# Patient Record
Sex: Female | Born: 1985
Health system: Southern US, Community
[De-identification: ages and names within clinical notes are randomized; demographics above are authoritative.]

## PROBLEM LIST (undated history)

## (undated) DIAGNOSIS — I209 Angina pectoris, unspecified: Secondary | ICD-10-CM

## (undated) DIAGNOSIS — G43909 Migraine, unspecified, not intractable, without status migrainosus: Secondary | ICD-10-CM

## (undated) DIAGNOSIS — R001 Bradycardia, unspecified: Secondary | ICD-10-CM

## (undated) DIAGNOSIS — N920 Excessive and frequent menstruation with regular cycle: Secondary | ICD-10-CM

## (undated) HISTORY — DX: Migraine, unspecified, not intractable, without status migrainosus: G43.909

## (undated) HISTORY — PX: TUBAL LIGATION: SHX77

---
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
## 2004-01-14 ENCOUNTER — Inpatient Hospital Stay (HOSPITAL_COMMUNITY): Admission: AD | Admit: 2004-01-14 | Discharge: 2004-01-14 | Payer: Self-pay | Admitting: Obstetrics and Gynecology

## 2004-06-12 ENCOUNTER — Emergency Department (HOSPITAL_COMMUNITY): Admission: EM | Admit: 2004-06-12 | Discharge: 2004-06-12 | Payer: Self-pay | Admitting: Emergency Medicine

## 2004-07-26 ENCOUNTER — Other Ambulatory Visit: Admission: RE | Admit: 2004-07-26 | Discharge: 2004-07-26 | Payer: Self-pay | Admitting: Obstetrics and Gynecology

## 2004-10-02 ENCOUNTER — Emergency Department (HOSPITAL_COMMUNITY): Admission: EM | Admit: 2004-10-02 | Discharge: 2004-10-02 | Payer: Self-pay | Admitting: Emergency Medicine

## 2004-11-28 ENCOUNTER — Emergency Department (HOSPITAL_COMMUNITY): Admission: EM | Admit: 2004-11-28 | Discharge: 2004-11-29 | Payer: Self-pay | Admitting: Emergency Medicine

## 2005-01-03 ENCOUNTER — Emergency Department (HOSPITAL_COMMUNITY): Admission: EM | Admit: 2005-01-03 | Discharge: 2005-01-03 | Payer: Self-pay | Admitting: Emergency Medicine

## 2005-01-09 ENCOUNTER — Emergency Department (HOSPITAL_COMMUNITY): Admission: EM | Admit: 2005-01-09 | Discharge: 2005-01-09 | Payer: Self-pay | Admitting: Family Medicine

## 2005-01-27 ENCOUNTER — Emergency Department (HOSPITAL_COMMUNITY): Admission: EM | Admit: 2005-01-27 | Discharge: 2005-01-27 | Payer: Self-pay | Admitting: Psychiatry

## 2005-03-04 ENCOUNTER — Emergency Department (HOSPITAL_COMMUNITY): Admission: EM | Admit: 2005-03-04 | Discharge: 2005-03-04 | Payer: Self-pay | Admitting: Emergency Medicine

## 2005-03-24 ENCOUNTER — Emergency Department (HOSPITAL_COMMUNITY): Admission: EM | Admit: 2005-03-24 | Discharge: 2005-03-24 | Payer: Self-pay | Admitting: Family Medicine

## 2005-05-19 ENCOUNTER — Emergency Department (HOSPITAL_COMMUNITY): Admission: EM | Admit: 2005-05-19 | Discharge: 2005-05-19 | Payer: Self-pay | Admitting: Emergency Medicine

## 2005-07-16 ENCOUNTER — Emergency Department (HOSPITAL_COMMUNITY): Admission: EM | Admit: 2005-07-16 | Discharge: 2005-07-16 | Payer: Self-pay | Admitting: Family Medicine

## 2005-09-08 ENCOUNTER — Inpatient Hospital Stay (HOSPITAL_COMMUNITY): Admission: AD | Admit: 2005-09-08 | Discharge: 2005-09-08 | Payer: Self-pay | Admitting: Family Medicine

## 2005-09-09 ENCOUNTER — Inpatient Hospital Stay (HOSPITAL_COMMUNITY): Admission: AD | Admit: 2005-09-09 | Discharge: 2005-09-10 | Payer: Self-pay | Admitting: *Deleted

## 2005-12-09 ENCOUNTER — Emergency Department (HOSPITAL_COMMUNITY): Admission: EM | Admit: 2005-12-09 | Discharge: 2005-12-09 | Payer: Self-pay | Admitting: Emergency Medicine

## 2006-10-09 ENCOUNTER — Emergency Department (HOSPITAL_COMMUNITY): Admission: EM | Admit: 2006-10-09 | Discharge: 2006-10-09 | Payer: Self-pay | Admitting: Emergency Medicine

## 2007-01-02 ENCOUNTER — Emergency Department (HOSPITAL_COMMUNITY): Admission: EM | Admit: 2007-01-02 | Discharge: 2007-01-02 | Payer: Self-pay | Admitting: Family Medicine

## 2007-10-01 ENCOUNTER — Ambulatory Visit: Payer: Self-pay | Admitting: Internal Medicine

## 2007-10-01 ENCOUNTER — Encounter (INDEPENDENT_AMBULATORY_CARE_PROVIDER_SITE_OTHER): Payer: Self-pay | Admitting: Nurse Practitioner

## 2007-10-01 LAB — CONVERTED CEMR LAB
Alkaline Phosphatase: 49 units/L (ref 39–117)
CO2: 23 meq/L (ref 19–32)
Creatinine, Ser: 0.83 mg/dL (ref 0.40–1.20)
Eosinophils Absolute: 0 10*3/uL (ref 0.0–0.7)
Eosinophils Relative: 1 % (ref 0–5)
Glucose, Bld: 91 mg/dL (ref 70–99)
HCT: 42.3 % (ref 36.0–46.0)
Hemoglobin: 13.7 g/dL (ref 12.0–15.0)
Lymphocytes Relative: 40 % (ref 12–46)
Lymphs Abs: 1.5 10*3/uL (ref 0.7–4.0)
MCV: 81.3 fL (ref 78.0–100.0)
Monocytes Absolute: 0.4 10*3/uL (ref 0.1–1.0)
Platelets: 279 10*3/uL (ref 150–400)
RDW: 13.8 % (ref 11.5–15.5)
Sodium: 139 meq/L (ref 135–145)
TSH: 1.155 microintl units/mL (ref 0.350–5.50)
Total Bilirubin: 0.3 mg/dL (ref 0.3–1.2)

## 2007-10-04 ENCOUNTER — Ambulatory Visit: Payer: Self-pay | Admitting: *Deleted

## 2007-10-07 ENCOUNTER — Ambulatory Visit (HOSPITAL_COMMUNITY): Admission: RE | Admit: 2007-10-07 | Discharge: 2007-10-07 | Payer: Self-pay | Admitting: Family Medicine

## 2007-10-18 ENCOUNTER — Inpatient Hospital Stay (HOSPITAL_COMMUNITY): Admission: AD | Admit: 2007-10-18 | Discharge: 2007-10-18 | Payer: Self-pay | Admitting: Obstetrics & Gynecology

## 2007-11-01 ENCOUNTER — Encounter (INDEPENDENT_AMBULATORY_CARE_PROVIDER_SITE_OTHER): Payer: Self-pay | Admitting: Family Medicine

## 2007-11-01 ENCOUNTER — Ambulatory Visit: Payer: Self-pay | Admitting: Internal Medicine

## 2008-07-30 ENCOUNTER — Emergency Department (HOSPITAL_COMMUNITY): Admission: EM | Admit: 2008-07-30 | Discharge: 2008-07-30 | Payer: Self-pay | Admitting: Family Medicine

## 2008-08-30 ENCOUNTER — Encounter: Admission: RE | Admit: 2008-08-30 | Discharge: 2008-08-30 | Payer: Self-pay | Admitting: Family Medicine

## 2009-02-06 ENCOUNTER — Emergency Department (HOSPITAL_COMMUNITY): Admission: EM | Admit: 2009-02-06 | Discharge: 2009-02-06 | Payer: Self-pay | Admitting: Family Medicine

## 2009-02-08 ENCOUNTER — Inpatient Hospital Stay (HOSPITAL_COMMUNITY): Admission: AD | Admit: 2009-02-08 | Discharge: 2009-02-08 | Payer: Self-pay | Admitting: Obstetrics and Gynecology

## 2009-09-24 ENCOUNTER — Inpatient Hospital Stay (HOSPITAL_COMMUNITY): Admission: AD | Admit: 2009-09-24 | Discharge: 2009-09-24 | Payer: Self-pay | Admitting: Obstetrics and Gynecology

## 2009-09-27 ENCOUNTER — Inpatient Hospital Stay (HOSPITAL_COMMUNITY): Admission: AD | Admit: 2009-09-27 | Discharge: 2009-09-29 | Payer: Self-pay | Admitting: Obstetrics and Gynecology

## 2010-03-17 ENCOUNTER — Emergency Department (HOSPITAL_COMMUNITY): Admission: EM | Admit: 2010-03-17 | Discharge: 2010-03-18 | Payer: Self-pay | Admitting: Emergency Medicine

## 2010-08-13 LAB — COMPREHENSIVE METABOLIC PANEL
ALT: 10 U/L (ref 0–35)
Alkaline Phosphatase: 131 U/L — ABNORMAL HIGH (ref 39–117)
BUN: 6 mg/dL (ref 6–23)
CO2: 20 mEq/L (ref 19–32)
Chloride: 105 mEq/L (ref 96–112)
GFR calc non Af Amer: >60 mL/min (ref >60)
Glucose, Bld: 89 mg/dL (ref 70–99)
Potassium: 3.9 mEq/L (ref 3.5–5.1)
Sodium: 134 mEq/L — ABNORMAL LOW (ref 135–145)
Total Bilirubin: 0.3 mg/dL (ref 0.3–1.2)
Total Protein: 6.3 g/dL (ref 6.0–8.3)

## 2010-08-13 LAB — CBC
HCT: 32.3 % — ABNORMAL LOW (ref 36.0–46.0)
HCT: 36.8 % (ref 36.0–46.0)
Hemoglobin: 12.6 g/dL (ref 12.0–15.0)
Platelets: 151 10*3/uL (ref 150–400)
RBC: 4.36 MIL/uL (ref 3.87–5.11)
RDW: 13 % (ref 11.5–15.5)
WBC: 7.5 10*3/uL (ref 4.0–10.5)

## 2010-08-13 LAB — RPR: RPR Ser Ql: NONREACTIVE

## 2010-08-30 LAB — URINALYSIS, ROUTINE W REFLEX MICROSCOPIC
Bilirubin Urine: NEGATIVE
Nitrite: NEGATIVE
Specific Gravity, Urine: <1.005 — ABNORMAL LOW (ref 1.005–1.030)
Urobilinogen, UA: 0.2 mg/dL (ref 0.0–1.0)

## 2010-08-30 LAB — POCT I-STAT, CHEM 8
HCT: 45 % (ref 36.0–46.0)
Hemoglobin: 15.3 g/dL — ABNORMAL HIGH (ref 12.0–15.0)
Potassium: 3.5 mEq/L (ref 3.5–5.1)
Sodium: 137 mEq/L (ref 135–145)

## 2010-08-30 LAB — TSH: TSH: 2.834 u[IU]/mL (ref 0.350–4.500)

## 2010-08-30 LAB — GC/CHLAMYDIA PROBE AMP, GENITAL: Chlamydia, DNA Probe: NEGATIVE

## 2010-08-30 LAB — WET PREP, GENITAL: Trich, Wet Prep: NONE SEEN

## 2010-08-30 LAB — POCT URINALYSIS DIP (DEVICE)
Glucose, UA: NEGATIVE mg/dL
Hgb urine dipstick: NEGATIVE
Nitrite: NEGATIVE

## 2010-09-05 LAB — DIFFERENTIAL
Basophils Absolute: 0 10*3/uL (ref 0.0–0.1)
Lymphocytes Relative: 32 % (ref 12–46)
Monocytes Absolute: 0.3 10*3/uL (ref 0.1–1.0)
Monocytes Relative: 7 % (ref 3–12)
Neutro Abs: 2.8 10*3/uL (ref 1.7–7.7)

## 2010-09-05 LAB — POCT I-STAT, CHEM 8
Chloride: 105 mEq/L (ref 96–112)
Creatinine, Ser: 1 mg/dL (ref 0.4–1.2)
Glucose, Bld: 61 mg/dL — ABNORMAL LOW (ref 70–99)
HCT: 40 % (ref 36.0–46.0)
Potassium: 3.7 mEq/L (ref 3.5–5.1)

## 2010-09-05 LAB — CBC
Hemoglobin: 12.2 g/dL (ref 12.0–15.0)
RBC: 4.28 MIL/uL (ref 3.87–5.11)
WBC: 4.7 10*3/uL (ref 4.0–10.5)

## 2010-10-11 NOTE — Discharge Summary (Signed)
   NAMEBABYGIRL, TRAGER                        ACCOUNT NO.:  [id number]   MEDICAL RECORD NO.:  [id number]                   PATIENT TYPE:  INP   LOCATION:  9133                                 FACILITY:  [facility]   PHYSICIAN:  Tanya S. Shawnie Pons, M.D.                DATE OF BIRTH:  [birth year]   DATE OF ADMISSION:  06/10/2002  DATE OF DISCHARGE:  06/14/2002                                 DISCHARGE SUMMARY   DISCHARGE DIAGNOSES:  1. Spontaneous vaginal delivery of viable female status post induction of     labor with Pitocin.  2. Preeclampsia with magnesium sulfate during labor, resolved.   DISCHARGE MEDICATIONS:  1. Ibuprofen 600 mg one tablet p.o. q.6h. p.r.n. pain.  2. Micronor one tablet p.o. daily.  3. Prenatal vitamins one tablet p.o. daily.  4. Ferrous sulfate 325 mg one tablet p.o. b.i.d. with meals.   BRIEF ADMISSION HISTORY:  The patient is a 25 year old G1, P0 who presented  at 35 and 2/[redacted] weeks gestation for induction of labor and [facility] laboratories.  Her blood pressure upon initial examination was 119/88.  She was augmented  during labor with Pitocin.  There were too many uterine contractions for  Cytotec to be used.  During labor she was deemed to be preeclamptic and was  managed with magnesium sulfate therapy.  This was continued for  approximately 24 hours postpartum.  The magnesium sulfate was discontinued  on postpartum day number two after the patient began diuresing and no longer  had headache.  The patient did well throughout the rest of her  hospitalization.  Anemia.  The patient does have sickle cell trait and was  known to be anemic prior to presenting to hospital.  She had been maintained  on ferrous sulfate 325 mg p.o. b.i.d.  She experienced some acute blood loss  during delivery and her hematocrit dropped to 7.8 on June 12, 2002.  Consequently, she will be discharged on her ferrous sulfate b.i.d. and will  need to be monitored as an outpatient.   DIET:   Regular.   ACTIVITY:  The patient was put on pelvic rest with no sexual activity for  six weeks.   FOLLOWUP CARE:  The patient is to return to [facility] in six weeks for  postpartum check and possible IUD placement.  However, patient states she  might make an appointment with Janine Limbo, M.D. instead.   DISCHARGE:  The patient will be discharged home, stable.     Rodolph Bong, M.D.                        Shelbie Proctor. Shawnie Pons, M.D.    AK/MEDQ  D:  06/14/2002  T:  06/15/2002  Job:  [id number]

## 2011-02-19 LAB — WET PREP, GENITAL: Trich, Wet Prep: NONE SEEN

## 2011-02-19 LAB — URINALYSIS, ROUTINE W REFLEX MICROSCOPIC
Bilirubin Urine: NEGATIVE
Hgb urine dipstick: NEGATIVE
Nitrite: NEGATIVE
Specific Gravity, Urine: 1.025
pH: 6

## 2011-02-19 LAB — POCT PREGNANCY, URINE
Operator id: 222261
Preg Test, Ur: NEGATIVE

## 2011-02-19 LAB — GC/CHLAMYDIA PROBE AMP, GENITAL: GC Probe Amp, Genital: NEGATIVE

## 2011-04-24 ENCOUNTER — Emergency Department (HOSPITAL_COMMUNITY)
Admission: EM | Admit: 2011-04-24 | Discharge: 2011-04-24 | Disposition: A | Discharge status: Home / Self Care | Payer: No Typology Code available for payment source | Attending: Emergency Medicine | Admitting: Emergency Medicine

## 2011-04-24 ENCOUNTER — Emergency Department (HOSPITAL_COMMUNITY): Payer: No Typology Code available for payment source

## 2011-04-24 ENCOUNTER — Encounter: Payer: Self-pay | Admitting: Emergency Medicine

## 2011-04-24 DIAGNOSIS — S40019A Contusion of unspecified shoulder, initial encounter: Secondary | ICD-10-CM | POA: Insufficient documentation

## 2011-04-24 DIAGNOSIS — S139XXA Sprain of joints and ligaments of unspecified parts of neck, initial encounter: Secondary | ICD-10-CM | POA: Insufficient documentation

## 2011-04-24 DIAGNOSIS — Y92410 Unspecified street and highway as the place of occurrence of the external cause: Secondary | ICD-10-CM | POA: Insufficient documentation

## 2011-04-24 DIAGNOSIS — S161XXA Strain of muscle, fascia and tendon at neck level, initial encounter: Secondary | ICD-10-CM

## 2011-04-24 DIAGNOSIS — R51 Headache: Secondary | ICD-10-CM | POA: Insufficient documentation

## 2011-04-24 MED ORDER — OXYCODONE-ACETAMINOPHEN 5-325 MG PO TABS
2.0000 | ORAL_TABLET | Freq: Once | ORAL | Status: AC
  Administered 2011-04-24: 1 via ORAL
  Filled 2011-04-24: qty 2

## 2011-04-24 MED ORDER — OXYCODONE-ACETAMINOPHEN 5-325 MG PO TABS: 2.0000 | ORAL_TABLET | ORAL | Status: AC | PRN
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
## 2011-09-08 ENCOUNTER — Telehealth: Payer: Self-pay | Admitting: Obstetrics and Gynecology

## 2011-09-08 NOTE — Telephone Encounter (Signed)
Routed to triage 

## 2011-09-09 ENCOUNTER — Telehealth: Payer: Self-pay | Admitting: Registered Nurse

## 2011-09-09 NOTE — Telephone Encounter (Signed)
TC from pt.   Informed of lab results of 06/2011.   Advised to call with any sx of outbreak of HSV.  Pt verbalizes comprehension.

## 2011-09-09 NOTE — Telephone Encounter (Signed)
Returning nancy's call

## 2011-09-09 NOTE — Telephone Encounter (Signed)
TC to pt. TC to pt. LM to return call.

## 2011-09-09 NOTE — Telephone Encounter (Signed)
TC to pt.   LM to return call.   May speak to anyone in triage.

## 2012-08-23 ENCOUNTER — Emergency Department (INDEPENDENT_AMBULATORY_CARE_PROVIDER_SITE_OTHER)
Admission: EM | Admit: 2012-08-23 | Discharge: 2012-08-23 | Disposition: A | Discharge status: Home / Self Care | Payer: Self-pay | Source: Home / Self Care | Attending: Emergency Medicine | Admitting: Emergency Medicine

## 2012-08-23 ENCOUNTER — Encounter (HOSPITAL_COMMUNITY): Payer: Self-pay | Admitting: *Deleted

## 2012-08-23 DIAGNOSIS — M549 Dorsalgia, unspecified: Secondary | ICD-10-CM

## 2012-08-23 MED ORDER — CYCLOBENZAPRINE HCL 10 MG PO TABS: 10.0000 mg | ORAL_TABLET | Freq: Two times a day (BID) | ORAL | Status: DC | PRN

## 2012-08-23 MED ORDER — IBUPROFEN 400 MG PO TABS: 400.0000 mg | ORAL_TABLET | Freq: Four times a day (QID) | ORAL | Status: DC | PRN

## 2012-08-23 NOTE — ED Provider Notes (Signed)
Medical screening examination/treatment/procedure(s) were performed by non-physician practitioner and as supervising physician I was immediately available for consultation/collaboration.  Torri Langston   Osher Oettinger, MD 08/23/12 1819 
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
## 2013-03-12 ENCOUNTER — Other Ambulatory Visit (HOSPITAL_COMMUNITY): Payer: Self-pay | Admitting: *Deleted

## 2013-03-12 DIAGNOSIS — N6310 Unspecified lump in the right breast, unspecified quadrant: Secondary | ICD-10-CM

## 2013-03-15 ENCOUNTER — Encounter (INDEPENDENT_AMBULATORY_CARE_PROVIDER_SITE_OTHER): Payer: Self-pay

## 2013-03-15 ENCOUNTER — Encounter (HOSPITAL_COMMUNITY): Payer: Self-pay

## 2013-03-15 ENCOUNTER — Ambulatory Visit (HOSPITAL_COMMUNITY)
Admission: RE | Admit: 2013-03-15 | Discharge: 2013-03-15 | Disposition: A | Discharge status: Home / Self Care | Payer: Self-pay | Source: Ambulatory Visit | Attending: Obstetrics and Gynecology | Admitting: Obstetrics and Gynecology

## 2013-03-15 VITALS — BP 104/60 | Temp 99.4°F | Ht 63.0 in | Wt 160.2 lb

## 2013-03-15 DIAGNOSIS — N6312 Unspecified lump in the right breast, upper inner quadrant: Secondary | ICD-10-CM | POA: Insufficient documentation

## 2013-03-15 DIAGNOSIS — Z1239 Encounter for other screening for malignant neoplasm of breast: Secondary | ICD-10-CM

## 2013-03-15 NOTE — Progress Notes (Signed)
Patient complained of right pebble sized breast lump x 1 month.  Pap Smear:  Pap smear not completed today. Last Pap smear was February 2013 by Dr. Stefano Gaul and normal per patient. Per patient has no history of an abnormal Pap smear. Pap smear result prior to most recent Pap smear in [facility].  Physical exam: Breasts Left breast larger than right breast. No skin abnormalities bilateral breasts. No nipple retraction bilateral breasts. No nipple discharge bilateral breasts. No lymphadenopathy. No lumps palpated left breast. Palpated a small moveable lump within the right breast at 2 o'clock 4 cm from the nipple. No complaints of pain or tenderness on exam. Referred patient to the [facility] for right breast ultrasound. Appointment scheduled for Thursday, March 24, 2013 at 1000.     Pelvic/Bimanual No Pap smear completed today since last Pap smear was February 2013 per patient. Pap smear not indicated per BCCCP guidelines.

## 2013-03-15 NOTE — Patient Instructions (Signed)
Taught Tanja Port how to perform BSE and gave educational materials to take home. Patient did not need a Pap smear today due to last Pap smear was in 2013 per patient. Told patient about free cervical cancer screenings to receive a Pap smear if would like one next year. Let her know [facility]CP will cover Pap smears every 3 years unless has a history of abnormal Pap smears. Referred patient to the [facility] for right breast ultrasound. Appointment scheduled for Thursday, March 24, 2013 at 1000. Patient aware of appointment and will be there.  Sharyn Creamer A Callies verbalized understanding.  [place]annock, Kathaleen Maser, RN 3:20 PM

## 2013-03-23 ENCOUNTER — Ambulatory Visit
Admission: RE | Admit: 2013-03-23 | Discharge: 2013-03-23 | Disposition: A | Discharge status: Home / Self Care | Payer: No Typology Code available for payment source | Source: Ambulatory Visit | Attending: Obstetrics and Gynecology | Admitting: Obstetrics and Gynecology

## 2013-03-23 DIAGNOSIS — N6310 Unspecified lump in the right breast, unspecified quadrant: Secondary | ICD-10-CM

## 2013-03-24 ENCOUNTER — Other Ambulatory Visit: Payer: Self-pay

## 2013-05-26 HISTORY — PX: BREAST BIOPSY: SHX20

## 2013-08-01 ENCOUNTER — Emergency Department (HOSPITAL_COMMUNITY)
Admission: EM | Admit: 2013-08-01 | Discharge: 2013-08-01 | Disposition: A | Discharge status: Home / Self Care | Payer: BC Managed Care – PPO | Source: Home / Self Care | Attending: Emergency Medicine | Admitting: Emergency Medicine

## 2013-08-01 ENCOUNTER — Other Ambulatory Visit (HOSPITAL_COMMUNITY)
Admission: RE | Admit: 2013-08-01 | Discharge: 2013-08-01 | Disposition: A | Discharge status: Home / Self Care | Payer: BC Managed Care – PPO | Source: Ambulatory Visit | Attending: Emergency Medicine | Admitting: Emergency Medicine

## 2013-08-01 ENCOUNTER — Encounter (HOSPITAL_COMMUNITY): Payer: Self-pay | Admitting: Emergency Medicine

## 2013-08-01 DIAGNOSIS — N760 Acute vaginitis: Secondary | ICD-10-CM | POA: Insufficient documentation

## 2013-08-01 DIAGNOSIS — Z113 Encounter for screening for infections with a predominantly sexual mode of transmission: Secondary | ICD-10-CM | POA: Insufficient documentation

## 2013-08-01 DIAGNOSIS — R42 Dizziness and giddiness: Secondary | ICD-10-CM

## 2013-08-01 DIAGNOSIS — I498 Other specified cardiac arrhythmias: Secondary | ICD-10-CM

## 2013-08-01 DIAGNOSIS — R001 Bradycardia, unspecified: Secondary | ICD-10-CM

## 2013-08-01 LAB — POCT I-STAT, CHEM 8
BUN: 6 mg/dL (ref 6–23)
CALCIUM ION: 1.17 mmol/L (ref 1.12–1.23)
CHLORIDE: 104 meq/L (ref 96–112)
Creatinine, Ser: 0.9 mg/dL (ref 0.50–1.10)
GLUCOSE: 90 mg/dL (ref 70–99)
HCT: 41 % (ref 36.0–46.0)
Hemoglobin: 13.9 g/dL (ref 12.0–15.0)
Potassium: 3.7 mEq/L (ref 3.7–5.3)
Sodium: 140 mEq/L (ref 137–147)
TCO2: 23 mmol/L (ref 0–100)

## 2013-08-01 LAB — RPR: RPR: NONREACTIVE

## 2013-08-01 LAB — HIV ANTIBODY (ROUTINE TESTING W REFLEX): HIV: NONREACTIVE

## 2013-08-01 MED ORDER — METRONIDAZOLE 0.75 % VA GEL: 1.0000 | Freq: Two times a day (BID) | VAGINAL | Status: DC

## 2013-08-01 NOTE — ED Notes (Signed)
Pelvic equipment available and patient in gown

## 2013-08-01 NOTE — ED Provider Notes (Signed)
CSN: [id number]     Arrival date & time 08/01/13  1230 History   First MD Initiated Contact with Patient 08/01/13 1325     Chief Complaint  Patient presents with  . Vaginal Discharge  . Dizziness   (Consider location/radiation/quality/duration/timing/severity/associated sxs/prior Treatment) HPI Comments: Patient presents with 2 complaints. First is malodorous vaginal discharge that has been present since she finished her period 4 days ago. She believes she may have bacterial vaginosis in the past with symptoms. Also, she had an episode of dizziness last night and seeing black spots in her vision. This is the first time that has ever happened to her. She laid down and felt better very quickly. This morning, she has a headache behind her left eye. Not having any more dizziness. She felt slightly short of breath while she was having the dizziness. She denies chest pain, palpitations, NVD. She has a history of iron deficiency anemia.  Patient is a 28 y.o. female presenting with vaginal discharge and dizziness.  Vaginal Discharge Associated symptoms: no abdominal pain, no dysuria, no fever, no nausea and no vomiting   Dizziness Associated symptoms: no chest pain, no nausea, no palpitations, no shortness of breath and no vomiting     History reviewed. No pertinent past medical history. Past Surgical History  Procedure Laterality Date  . Tubal ligation     Family History  Problem Relation Age of Onset  . Hypertension Mother    History  Substance Use Topics  . Smoking status: Current Every Day Smoker  . Smokeless tobacco: Never Used  . Alcohol Use: No   OB History   Grav Para Term Preterm Abortions TAB SAB Ect Mult Living   2 2 2       2      Review of Systems  Constitutional: Negative for fever and chills.  Eyes: Positive for visual disturbance.  Respiratory: Negative for cough and shortness of breath.   Cardiovascular: Negative for chest pain, palpitations and leg swelling.   Gastrointestinal: Negative for nausea, vomiting and abdominal pain.  Endocrine: Negative for polydipsia and polyuria.  Genitourinary: Positive for vaginal discharge. Negative for dysuria, urgency, frequency, hematuria, flank pain, vaginal bleeding, difficulty urinating, genital sores and pelvic pain.  Musculoskeletal: Negative for arthralgias and myalgias.  Skin: Negative for rash.  Neurological: Positive for dizziness. Negative for weakness and light-headedness.    Allergies  Review of patient's allergies indicates no known allergies.  Home Medications   Current Outpatient Rx  Name  Route  Sig  Dispense  Refill  . Naproxen Sodium (ALEVE PO)   Oral   Take by mouth.         . metroNIDAZOLE (METROGEL VAGINAL) 0.75 % vaginal gel   Vaginal   Place 1 Applicatorful vaginally 2 (two) times daily. For 5 days   70 g   0    BP 111/64  Pulse 90  Temp(Src) 98.8 F (37.1 C) (Oral)  Resp 16  SpO2 100%  LMP 07/24/2013 Physical Exam  Nursing note and vitals reviewed. Constitutional: She is oriented to person, place, and time. Vital signs are normal. She appears well-developed and well-nourished. No distress.  HENT:  Head: Normocephalic and atraumatic.  Eyes: Conjunctivae are normal. Right eye exhibits no discharge. Left eye exhibits no discharge.  Neck: Normal range of motion. Neck supple. No thyromegaly present.  Cardiovascular: Normal rate, regular rhythm and normal heart sounds.  Exam reveals no gallop and no friction rub.   No murmur heard. Pulmonary/Chest: Breath sounds normal.  No respiratory distress. She has no wheezes. She has no rales.  Abdominal: Soft. She exhibits no distension and no mass. There is no tenderness. There is no rebound and no guarding.  Genitourinary: Vaginal discharge (thin white) found.  Lymphadenopathy:    She has no cervical adenopathy.  Neurological: She is alert and oriented to person, place, and time. She has normal strength. No cranial nerve  deficit. She exhibits normal muscle tone. Coordination normal.  Skin: Skin is warm and dry. No rash noted. She is not diaphoretic.  Psychiatric: She has a normal mood and affect. Judgment and thought content normal.    ED Course  Procedures (including critical care time) Labs Review Labs Reviewed  RPR  HIV ANTIBODY (ROUTINE TESTING)  I-STAT CHEM 8, ED  POCT I-STAT, CHEM 8  CERVICOVAGINAL ANCILLARY ONLY   Imaging Review No results found.  EKG shows sinus bradycardia  MDM   1. Vaginitis   2. Dizziness   3. Bradycardia    Patient is not orthostatic. Her EKG revealed sinus bradycardia. Her i-STAT is normal. She is being referred to cardiology for evaluation of possible symptomatic bradycardia. Treating the probable bacterial vaginosis with MetroGel twice a day for 5 days. Followup when necessary if not improving   Meds ordered this encounter  Medications  . Naproxen Sodium (ALEVE PO)    Sig: Take by mouth.  . metroNIDAZOLE (METROGEL VAGINAL) 0.75 % vaginal gel    Sig: Place 1 Applicatorful vaginally 2 (two) times daily. For 5 days    Dispense:  70 g    Refill:  0    Order Specific Question:  Supervising Provider    Answer:  Jake Michaelis, DAVID C [[id number]]       Liam Graham, PA-C 08/01/13 1531

## 2013-08-01 NOTE — ED Notes (Signed)
Patient states her main concern is vaginal discharge, thinks this is bv and has an odor.  Menstrual cycle went off on 3/5 and noticed odor right away.   Reports dizziness, seeing dark spots and dots in vision field, and dizzy.  Overall felt bad last night.  Today his slight headache , but feels better than last night.

## 2013-08-01 NOTE — ED Notes (Signed)
Stressed the importance of follow up with cardiology. Patient states she is going right now to make contact with cardiology office.

## 2013-08-01 NOTE — Discharge Instructions (Signed)
Bradycardia Bradycardia is a term for a heart rate (pulse) that, in adults, is slower than 60 beats per minute. A normal rate is 60 to 100 beats per minute. A heart rate below 60 beats per minute may be normal for some adults with healthy hearts. If the rate is too slow, the heart may have trouble pumping the volume of blood the body needs. If the heart rate gets too low, blood flow to the brain may be decreased and may make you feel lightheaded, dizzy, or faint. The heart has a natural pacemaker in the top of the heart called the SA node (sinoatrial or sinus node). This pacemaker sends out regular electrical signals to the muscle of the heart, telling the heart muscle when to beat (contract). The electrical signal travels from the upper parts of the heart (atria) through the AV node (atrioventricular node), to the lower chambers of the heart (ventricles). The ventricles squeeze, pumping the blood from your heart to your lungs and to the rest of your body. CAUSES   Problem with the heart's electrical system.  Problem with the heart's natural pacemaker.  Heart disease, damage, or infection.  Medications.  Problems with minerals and salts (electrolytes). SYMPTOMS   Fainting (syncope).  Fatigue and weakness.  Shortness of breath (dyspnea).  Chest pain (angina).  Drowsiness.  Confusion. DIAGNOSIS   An electrocardiogram (ECG) can help your caregiver determine the type of slow heart rate you have.  If the cause is not seen on an ECG, you may need to wear a heart monitor that records your heart rhythm for several hours or days.  Blood tests. TREATMENT   Electrolyte supplements.  Medications.  Withholding medication which is causing a slow heart rate.  Pacemaker placement. SEEK IMMEDIATE MEDICAL CARE IF:   You feel lightheaded or faint.  You develop an irregular heart rate.  You feel chest pain or have trouble breathing. MAKE SURE YOU:   Understand these  instructions.  Will watch your condition.  Will get help right away if you are not doing well or get worse. Document Released: 02/01/2002 Document Revised: 08/04/2011 Document Reviewed: 12/29/2007 [facility] Patient Information 2014 [place].  Dizziness Dizziness is a common problem. It is a feeling of unsteadiness or lightheadedness. You may feel like you are about to faint. Dizziness can lead to injury if you stumble or fall. A person of any age group can suffer from dizziness, but dizziness is more common in older adults. CAUSES  Dizziness can be caused by many different things, including:  Middle ear problems.  Standing for too long.  Infections.  An allergic reaction.  Aging.  An emotional response to something, such as the sight of blood.  Side effects of medicines.  Fatigue.  Problems with circulation or blood pressure.  Excess use of alcohol, medicines, or illegal drug use.  Breathing too fast (hyperventilation).  An arrhythmia or problems with your heart rhythm.  Low red blood cell count (anemia).  Pregnancy.  Vomiting, diarrhea, fever, or other illnesses that cause dehydration.  Diseases or conditions such as Parkinson's disease, high blood pressure (hypertension), diabetes, and thyroid problems.  Exposure to extreme heat. DIAGNOSIS  To find the cause of your dizziness, your caregiver may do a physical exam, lab tests, radiologic imaging scans, or an electrocardiography test (ECG).  TREATMENT  Treatment of dizziness depends on the cause of your symptoms and can vary greatly. HOME CARE INSTRUCTIONS   Drink enough fluids to keep your urine clear or pale yellow.  This is especially important in very hot weather. In the elderly, it is also important in cold weather.  If your dizziness is caused by medicines, take them exactly as directed. When taking blood pressure medicines, it is especially important to get up slowly.  Rise slowly from chairs and  steady yourself until you feel okay.  In the morning, first sit up on the side of the bed. When this seems okay, stand slowly while holding onto something until you know your balance is fine.  If you need to stand in one place for a long time, be sure to move your legs often. Tighten and relax the muscles in your legs while standing.  If dizziness continues to be a problem, have someone stay with you for a day or two. Do this until you feel you are well enough to stay alone. Have the person call your caregiver if he or she notices changes in you that are concerning.  Do not drive or use heavy machinery if you feel dizzy.  Do not drink alcohol. SEEK IMMEDIATE MEDICAL CARE IF:   Your dizziness or lightheadedness gets worse.  You feel nauseous or vomit.  You develop problems with talking, walking, weakness, or using your arms, hands, or legs.  You are not thinking clearly or you have difficulty forming sentences. It may take a friend or family member to determine if your thinking is normal.  You develop chest pain, abdominal pain, shortness of breath, or sweating.  Your vision changes.  You notice any bleeding.  You have side effects from medicine that seems to be getting worse rather than better. MAKE SURE YOU:   Understand these instructions.  Will watch your condition.  Will get help right away if you are not doing well or get worse. Document Released: 11/05/2000 Document Revised: 08/04/2011 Document Reviewed: 11/29/2010 [facility] Patient Information 2014 [place], [place].  Near-Syncope Near-syncope (commonly known as near fainting) is sudden weakness, dizziness, or feeling like you might pass out. During an episode of near-syncope, you may also develop pale skin, have tunnel vision, or feel sick to your stomach (nauseous). Near-syncope may occur when getting up after sitting or while standing for a long time. It is caused by a sudden decrease in blood flow to the brain. This  decrease can result from various causes or triggers, most of which are not serious. However, because near-syncope can sometimes be a sign of something serious, a medical evaluation is required. The specific cause is often not determined. HOME CARE INSTRUCTIONS  Monitor your condition for any changes. The following actions may help to alleviate any discomfort you are experiencing:  Have someone stay with you until you feel stable.  Lie down right away if you start feeling like you might faint. Breathe deeply and steadily. Wait until all the symptoms have passed. Most of these episodes last only a few minutes. You may feel tired for several hours.   Drink enough fluids to keep your urine clear or pale yellow.   If you are taking blood pressure or heart medicine, get up slowly when seated or lying down. Take several minutes to sit and then stand. This can reduce dizziness.  Follow up with your health care provider as directed. SEEK IMMEDIATE MEDICAL CARE IF:   You have a severe headache.   You have unusual pain in the chest, abdomen, or back.   You are bleeding from the mouth or rectum, or you have black or tarry stool.   You have  an irregular or very fast heartbeat.   You have repeated fainting or have seizure-like jerking during an episode.   You faint when sitting or lying down.   You have confusion.   You have difficulty walking.   You have severe weakness.   You have vision problems.  MAKE SURE YOU:   Understand these instructions.  Will watch your condition.  Will get help right away if you are not doing well or get worse. Document Released: 05/12/2005 Document Revised: 01/12/2013 Document Reviewed: 10/15/2012 [facility] Patient Information 2014 [place].  Vaginitis Vaginitis is an inflammation of the vagina. It is most often caused by a change in the normal balance of the bacteria and yeast that live in the vagina. This change in balance causes an  overgrowth of certain bacteria or yeast, which causes the inflammation. There are different types of vaginitis, but the most common types are:  Bacterial vaginosis.  Yeast infection (candidiasis).  Trichomoniasis vaginitis. This is a sexually transmitted infection (STI).  Viral vaginitis.  Atropic vaginitis.  Allergic vaginitis. CAUSES  The cause depends on the type of vaginitis. Vaginitis can be caused by:  Bacteria (bacterial vaginosis).  Yeast (yeast infection).  A parasite (trichomoniasis vaginitis)  A virus (viral vaginitis).  Low hormone levels (atrophic vaginitis). Low hormone levels can occur during pregnancy, breastfeeding, or after menopause.  Irritants, such as bubble baths, scented tampons, and feminine sprays (allergic vaginitis). Other factors can change the normal balance of the yeast and bacteria that live in the vagina. These include:  Antibiotic medicines.  Poor hygiene.  Diaphragms, vaginal sponges, spermicides, birth control pills, and intrauterine devices (IUD).  Sexual intercourse.  Infection.  Uncontrolled diabetes.  A weakened immune system. SYMPTOMS  Symptoms can vary depending on the cause of the vaginitis. Common symptoms include:  Abnormal vaginal discharge.  The discharge is white, gray, or yellow with bacterial vaginosis.  The discharge is thick, white, and cheesy with a yeast infection.  The discharge is frothy and yellow or greenish with trichomoniasis.  A bad vaginal odor.  The odor is fishy with bacterial vaginosis.  Vaginal itching, pain, or swelling.  Painful intercourse.  Pain or burning when urinating. Sometimes, there are no symptoms. TREATMENT  Treatment will vary depending on the type of infection.   Bacterial vaginosis and trichomoniasis are often treated with antibiotic creams or pills.  Yeast infections are often treated with antifungal medicines, such as vaginal creams or suppositories.  Viral vaginitis  has no cure, but symptoms can be treated with medicines that relieve discomfort. Your sexual partner should be treated as well.  Atrophic vaginitis may be treated with an estrogen cream, pill, suppository, or vaginal ring. If vaginal dryness occurs, lubricants and moisturizing creams may help. You may be told to avoid scented soaps, sprays, or douches.  Allergic vaginitis treatment involves quitting the use of the product that is causing the problem. Vaginal creams can be used to treat the symptoms. HOME CARE INSTRUCTIONS   Take all medicines as directed by your caregiver.  Keep your genital area clean and dry. Avoid soap and only rinse the area with water.  Avoid douching. It can remove the healthy bacteria in the vagina.  Do not use tampons or have sexual intercourse until your vaginitis has been treated. Use sanitary pads while you have vaginitis.  Wipe from front to back. This avoids the spread of bacteria from the rectum to the vagina.  Let air reach your genital area.  Wear cotton underwear to  decrease moisture buildup.  Avoid wearing underwear while you sleep until your vaginitis is gone.  Avoid tight pants and underwear or nylons without a cotton panel.  Take off wet clothing (especially bathing suits) as soon as possible.  Use mild, non-scented products. Avoid using irritants, such as:  Scented feminine sprays.  Fabric softeners.  Scented detergents.  Scented tampons.  Scented soaps or bubble baths.  Practice safe sex and use condoms. Condoms may prevent the spread of trichomoniasis and viral vaginitis. SEEK MEDICAL CARE IF:   You have abdominal pain.  You have a fever or persistent symptoms for more than 2 3 days.  You have a fever and your symptoms suddenly get worse. Document Released: 03/09/2007 Document Revised: 02/04/2012 Document Reviewed: 10/23/2011 [facility] Patient Information 2014 [place].

## 2013-08-02 LAB — CERVICOVAGINAL ANCILLARY ONLY
CHLAMYDIA, DNA PROBE: NEGATIVE
Neisseria Gonorrhea: NEGATIVE
Wet Prep (BD Affirm): NEGATIVE
Wet Prep (BD Affirm): NEGATIVE
Wet Prep (BD Affirm): POSITIVE — AB

## 2013-08-02 NOTE — ED Provider Notes (Signed)
Medical screening examination/treatment/procedure(s) were performed by non-physician practitioner and as supervising physician I was immediately available for consultation/collaboration.  Philipp Deputy, M.D.  Harden Mo, MD 08/02/13 [phone]

## 2013-08-02 NOTE — ED Notes (Addendum)
HIV/RPR non-reactive, Affirm: Candida and Trich neg., Gardnerella pos. Pt. adequately treated with Metrogel. Mary Harrington 08/02/2013 GC/Chlamydia neg. 08/02/2013

## 2013-08-03 NOTE — ED Notes (Signed)
Call from Maryanna Shape to ascertain when an appropriate referral date would be for patient

## 2013-08-19 ENCOUNTER — Other Ambulatory Visit: Payer: Self-pay | Admitting: Obstetrics and Gynecology

## 2013-08-19 DIAGNOSIS — N6310 Unspecified lump in the right breast, unspecified quadrant: Secondary | ICD-10-CM

## 2013-08-19 DIAGNOSIS — D249 Benign neoplasm of unspecified breast: Secondary | ICD-10-CM

## 2013-09-16 ENCOUNTER — Ambulatory Visit
Admission: RE | Admit: 2013-09-16 | Discharge: 2013-09-16 | Disposition: A | Discharge status: Home / Self Care | Payer: BC Managed Care – PPO | Source: Ambulatory Visit | Attending: Obstetrics and Gynecology | Admitting: Obstetrics and Gynecology

## 2013-09-16 ENCOUNTER — Other Ambulatory Visit: Payer: BC Managed Care – PPO

## 2013-09-16 ENCOUNTER — Encounter (INDEPENDENT_AMBULATORY_CARE_PROVIDER_SITE_OTHER): Payer: Self-pay

## 2013-09-16 DIAGNOSIS — N6310 Unspecified lump in the right breast, unspecified quadrant: Secondary | ICD-10-CM

## 2014-03-13 ENCOUNTER — Ambulatory Visit (INDEPENDENT_AMBULATORY_CARE_PROVIDER_SITE_OTHER): Payer: BC Managed Care – PPO | Admitting: Family

## 2014-03-13 ENCOUNTER — Other Ambulatory Visit (INDEPENDENT_AMBULATORY_CARE_PROVIDER_SITE_OTHER): Payer: BC Managed Care – PPO

## 2014-03-13 ENCOUNTER — Encounter: Payer: Self-pay | Admitting: Family

## 2014-03-13 ENCOUNTER — Telehealth: Payer: Self-pay | Admitting: Family

## 2014-03-13 VITALS — BP 110/70 | HR 74 | Temp 98.4°F | Resp 18 | Ht 63.0 in | Wt 153.0 lb

## 2014-03-13 DIAGNOSIS — R42 Dizziness and giddiness: Secondary | ICD-10-CM | POA: Diagnosis not present

## 2014-03-13 DIAGNOSIS — R5383 Other fatigue: Secondary | ICD-10-CM

## 2014-03-13 DIAGNOSIS — F329 Major depressive disorder, single episode, unspecified: Secondary | ICD-10-CM

## 2014-03-13 DIAGNOSIS — R4589 Other symptoms and signs involving emotional state: Secondary | ICD-10-CM | POA: Insufficient documentation

## 2014-03-13 LAB — CBC WITH DIFFERENTIAL/PLATELET
BASOS PCT: 0.6 % (ref 0.0–3.0)
Basophils Absolute: 0 10*3/uL (ref 0.0–0.1)
EOS ABS: 0.1 10*3/uL (ref 0.0–0.7)
Eosinophils Relative: 1.6 % (ref 0.0–5.0)
HCT: 37.3 % (ref 36.0–46.0)
HEMOGLOBIN: 12.3 g/dL (ref 12.0–15.0)
Lymphocytes Relative: 37.9 % (ref 12.0–46.0)
Lymphs Abs: 1.6 10*3/uL (ref 0.7–4.0)
MCHC: 33 g/dL (ref 30.0–36.0)
MCV: 82.1 fl (ref 78.0–100.0)
MONO ABS: 0.3 10*3/uL (ref 0.1–1.0)
Monocytes Relative: 6.8 % (ref 3.0–12.0)
Neutro Abs: 2.2 10*3/uL (ref 1.4–7.7)
Neutrophils Relative %: 53.1 % (ref 43.0–77.0)
Platelets: 204 10*3/uL (ref 150.0–400.0)
RBC: 4.55 Mil/uL (ref 3.87–5.11)
RDW: 13.8 % (ref 11.5–15.5)
WBC: 4.2 10*3/uL (ref 4.0–10.5)

## 2014-03-13 LAB — BASIC METABOLIC PANEL
BUN: 11 mg/dL (ref 6–23)
CO2: 25 mEq/L (ref 19–32)
CREATININE: 0.8 mg/dL (ref 0.4–1.2)
Calcium: 9.2 mg/dL (ref 8.4–10.5)
Chloride: 105 mEq/L (ref 96–112)
GFR: 117.31 mL/min (ref >60.00)
GLUCOSE: 86 mg/dL (ref 70–99)
Potassium: 3.9 mEq/L (ref 3.5–5.1)
Sodium: 141 mEq/L (ref 135–145)

## 2014-03-13 LAB — TSH: TSH: 1.22 u[IU]/mL (ref 0.35–4.50)

## 2014-03-13 NOTE — Progress Notes (Signed)
Pre visit review using our clinic review tool, if applicable. No additional management support is needed unless otherwise documented below in the visit note. 

## 2014-03-13 NOTE — Assessment & Plan Note (Signed)
Recurrent episode of dizziness. Orthostatic vital signs obtained were negative. Could be related to the congestion in her head. If symptoms continue with resolving cold symptoms will refer to cardiology for further evalation.

## 2014-03-13 NOTE — Patient Instructions (Signed)
Thank you for choosing [facility].  Summary/Instructions:   Please stop by the lab prior to leaving for your blood work - we will be in contact regarding the results when they are available.   Please schedule a time for your physical at your convenience  If your symptoms worsen or fail to improve please let us know or if emergent go to the emergency room.  For your symptoms please continue to take either the claritan or zyrtec and ADD flonase as described on the package information. You may also use tylenol as needed for headaches/fever, and sudafed as needed for congestion.    Fatigue Fatigue is a feeling of tiredness, lack of energy, lack of motivation, or feeling tired all the time. Having enough rest, good nutrition, and reducing stress will normally reduce fatigue. Consult your caregiver if it persists. The nature of your fatigue will help your caregiver to find out its cause. The treatment is based on the cause.  CAUSES  There are many causes for fatigue. Most of the time, fatigue can be traced to one or more of your habits or routines. Most causes fit into one or more of three general areas. They are: Lifestyle problems  Sleep disturbances.  Overwork.  Physical exertion.  Unhealthy habits.  Poor eating habits or eating disorders.  Alcohol and/or drug use .  Lack of proper nutrition (malnutrition). Psychological problems  Stress and/or anxiety problems.  Depression.  Grief.  Boredom. Medical Problems or Conditions  Anemia.  Pregnancy.  Thyroid gland problems.  Recovery from major surgery.  Continuous pain.  Emphysema or asthma that is not well controlled  Allergic conditions.  Diabetes.  Infections (such as mononucleosis).  Obesity.  Sleep disorders, such as sleep apnea.  Heart failure or other heart-related problems.  Cancer.  Kidney disease.  Liver disease.  Effects of certain medicines such as antihistamines, cough and cold  remedies, prescription pain medicines, heart and blood pressure medicines, drugs used for treatment of cancer, and some antidepressants. SYMPTOMS  The symptoms of fatigue include:   Lack of energy.  Lack of drive (motivation).  Drowsiness.  Feeling of indifference to the surroundings. DIAGNOSIS  The details of how you feel help guide your caregiver in finding out what is causing the fatigue. You will be asked about your present and past health condition. It is important to review all medicines that you take, including prescription and non-prescription items. A thorough exam will be done. You will be questioned about your feelings, habits, and normal lifestyle. Your caregiver may suggest blood tests, urine tests, or other tests to look for common medical causes of fatigue.  TREATMENT  Fatigue is treated by correcting the underlying cause. For example, if you have continuous pain or depression, treating these causes will improve how you feel. Similarly, adjusting the dose of certain medicines will help in reducing fatigue.  HOME CARE INSTRUCTIONS   Try to get the required amount of good sleep every night.  Eat a healthy and nutritious diet, and drink enough water throughout the day.  Practice ways of relaxing (including yoga or meditation).  Exercise regularly.  Make plans to change situations that cause stress. Act on those plans so that stresses decrease over time. Keep your work and personal routine reasonable.  Avoid street drugs and minimize use of alcohol.  Start taking a daily multivitamin after consulting your caregiver. SEEK MEDICAL CARE IF:   You have persistent tiredness, which cannot be accounted for.  You have fever.  You  have unintentional weight loss.  You have headaches.  You have disturbed sleep throughout the night.  You are feeling sad.  You have constipation.  You have dry skin.  You have gained weight.  You are taking any new or different medicines  that you suspect are causing fatigue.  You are unable to sleep at night.  You develop any unusual swelling of your legs or other parts of your body. SEEK IMMEDIATE MEDICAL CARE IF:   You are feeling confused.  Your vision is blurred.  You feel faint or pass out.  You develop severe headache.  You develop severe abdominal, pelvic, or back pain.  You develop chest pain, shortness of breath, or an irregular or fast heartbeat.  You are unable to pass a normal amount of urine.  You develop abnormal bleeding such as bleeding from the rectum or you vomit blood.  You have thoughts about harming yourself or committing suicide.  You are worried that you might harm someone else. MAKE SURE YOU:   Understand these instructions.  Will watch your condition.  Will get help right away if you are not doing well or get worse. Document Released: 03/09/2007 Document Revised: 08/04/2011 Document Reviewed: 09/13/2013 [facility] Patient Information 2015 [place], [place]. This information is not intended to replace advice given to you by your health care provider. Make sure you discuss any questions you have with your health care provider.  Thank you for enrolling in [facility]. Please follow the instructions below to securely access your online medical record. MyChart allows you to send messages to your doctor, view your test results, renew your prescriptions, schedule appointments, and more.  How Do I Sign Up? 1. In your Internet browser, go to [email or web address]. 2. Click on the New  User? link in the Sign In box.  3. Enter your MyChart Access Code exactly as it appears below. You will not need to use this code after you have completed the sign-up process. If you do not sign up before the expiration date, you must request a new code. MyChart Access Code: [email or web address] Expires: 05/12/2014  1:43 PM  4. Enter the last four digits of your Social Security Number (xxxx) and Date of  Birth (mm/dd/yyyy) as indicated and click Next. You will be taken to the next sign-up page. 5. Create a MyChart ID. This will be your MyChart login ID and cannot be changed, so think of one that is secure and easy to remember. 6. Create a MyChart password. You can change your password at any time. 7. Enter your Password Reset Question and Answer and click Next. This can be used at a later time if you forget your password.  8. Select your communication preference, and if applicable enter your e-mail address. You will receive e-mail notification when new information is available in MyChart by choosing to receive e-mail notifications and filling in your e-mail. 9. Click Sign In. You can now view your medical record.   Additional Information If you have questions, you can email [email or web address] or call [phone] to talk to our [facility] staff. Remember, MyChart is NOT to be used for urgent needs. For medical emergencies, dial 911.

## 2014-03-13 NOTE — Progress Notes (Signed)
   Subjective:    Patient ID: Mary Harrington, female    DOB: 1985/10/11, 28 y.o.   MRN: [id number]  CC: dizziness / fatigue  HPI:  Mary Harrington is a 28 y.o. female who presents today to establish care.   1) Headache - Described as "sluggish" on the left temporal region. Has taken sinus medicine and cannot get pressure to release. Previous history of migraine as a child but stated she outgrew them. Denies sensitivity to light or sound, throbbing, nausea, or vomiting. There is nothing that make it better or worse. It has maintained fairly steadily with no changes.     2) Coughing - Has been going on for 3 days. Notice some mild voice change. Has tried over the counter mediation which provided little relief. Currently experiencing some congestion in her head which has not changed over time. Denies any fever, chills, nausea, vomiting, diarrhea, or body aches.   3) Dizzy and seeing silver spots - Had this happen previously when she was treated for bradycardia and and a syncopal episode. States she passed and when she came to she drove herself to the urgent care. She followed up with Dr. Doylene Canard who she states could not find anything and stated there was no further cardiac work-up needed. Currently denies any chest pain/discomfort, heart palpitations or shortness of breath.   4) Depressed mood / fatigue - has never been diagnosed with depression. Was previously referred to see a psychologist but did not go. She has a stressful job and family life which contributes to this.  Denies suicidal or homicidal ideations. Indicates it is different life changing events. Does not want to take any medications if she can avoid it.   No Known Allergies Current Outpatient Prescriptions on File Prior to Visit  Medication Sig Dispense Refill  . metroNIDAZOLE (METROGEL VAGINAL) 0.75 % vaginal gel Place 1 Applicatorful vaginally 2 (two) times daily. For 5 days  70 g  0  . Naproxen Sodium (ALEVE PO) Take by mouth.        No current facility-administered medications on file prior to visit.   Past Medical History  Diagnosis Date  . Migraines    Family History  Problem Relation Age of Onset  . Hypertension Mother    Review of Systems    See HPI Objective:    BP 130/66  Pulse 86  Temp(Src) 98.4 F (36.9 C) (Oral)  Resp 18  Ht 5\' 3"  (1.6 m)  Wt 153 lb (69.4 kg)  BMI 27.11 kg/m2  SpO2 98% Nursing note and vital signs reviewed.  Physical Exam  Constitutional: She is oriented to person, place, and time. She appears well-developed and well-nourished. No distress.  HENT:  Right Ear: Hearing, tympanic membrane, external ear and ear canal normal.  Left Ear: Hearing, tympanic membrane, external ear and ear canal normal.  Turbinates appear slightly swollen and pale bilaterally. Septum midline.   Neck: Neck supple.  Cardiovascular: Normal rate, regular rhythm and normal heart sounds.   Pulmonary/Chest: Effort normal and breath sounds normal.  Lymphadenopathy:    She has no cervical adenopathy.  Neurological: She is alert and oriented to person, place, and time.  Skin: Skin is warm and dry.  Psychiatric: She has a normal mood and affect. Her behavior is normal. Judgment and thought content normal.       Assessment & Plan:

## 2014-03-13 NOTE — Assessment & Plan Note (Signed)
Appears stable. No thoughts of suicidal or homicidal ideation. Will evaluate lab work for physiological cause as described previously. Pt desires not to be on medication - will discuss potential counseling.

## 2014-03-13 NOTE — Telephone Encounter (Signed)
Please call the patient to inform her that her labs that were drawn were all normal. Therefore the fatigue is most likely related to her not feeling well and possibly the depressed mood. We can further discuss this on her appointment on 10/29 or sooner if needed.

## 2014-03-13 NOTE — Assessment & Plan Note (Signed)
Appears on-going. Obtain TSH, CBC, and BMET to rule our physiological causes. Could possibly be related to the depressed mood.

## 2014-03-14 ENCOUNTER — Ambulatory Visit: Payer: BC Managed Care – PPO | Admitting: Family

## 2014-03-14 NOTE — Telephone Encounter (Signed)
Called pt with results. No answer left message.

## 2014-03-14 NOTE — Telephone Encounter (Signed)
Pt called back. Let her know that her labs were normal and her tiredness could be from her depressed mood and not feeling well. She wishes to wait until next appointment to discuss further issues if needed.

## 2014-03-23 ENCOUNTER — Other Ambulatory Visit: Payer: Self-pay | Admitting: Obstetrics and Gynecology

## 2014-03-23 ENCOUNTER — Encounter: Payer: BC Managed Care – PPO | Admitting: Family

## 2014-03-23 DIAGNOSIS — D241 Benign neoplasm of right breast: Secondary | ICD-10-CM

## 2014-03-27 ENCOUNTER — Encounter: Payer: Self-pay | Admitting: Family

## 2014-04-03 ENCOUNTER — Other Ambulatory Visit: Payer: BC Managed Care – PPO

## 2014-04-10 ENCOUNTER — Encounter (HOSPITAL_COMMUNITY): Payer: Self-pay | Admitting: Emergency Medicine

## 2014-04-10 ENCOUNTER — Emergency Department (HOSPITAL_COMMUNITY)
Admission: EM | Admit: 2014-04-10 | Discharge: 2014-04-10 | Disposition: A | Discharge status: Home / Self Care | Payer: BC Managed Care – PPO | Source: Home / Self Care | Attending: Emergency Medicine | Admitting: Emergency Medicine

## 2014-04-10 DIAGNOSIS — J029 Acute pharyngitis, unspecified: Secondary | ICD-10-CM

## 2014-04-10 LAB — POCT RAPID STREP A: Streptococcus, Group A Screen (Direct): NEGATIVE

## 2014-04-10 MED ORDER — HYDROCODONE-HOMATROPINE 5-1.5 MG/5ML PO SYRP: 5.0000 mL | ORAL_SOLUTION | Freq: Four times a day (QID) | ORAL | Status: DC | PRN

## 2014-04-10 MED ORDER — AMOXICILLIN 500 MG PO CAPS: 500.0000 mg | ORAL_CAPSULE | Freq: Two times a day (BID) | ORAL | Status: DC

## 2014-04-10 NOTE — ED Provider Notes (Signed)
CSN: [id number]     Arrival date & time 04/10/14  1942 History   First MD Initiated Contact with Patient 04/10/14 2023     Chief Complaint  Patient presents with  . Cough   (Consider location/radiation/quality/duration/timing/severity/associated sxs/prior Treatment) HPI She is a 28 year old woman here for evaluation of sore throat and cough. Her symptoms started on Friday and have been getting progressively worse. The sore throat was initially generalized, but now is worse on the left. The cough is nonproductive, but associated with some chest congestion. She also reports a right frontal headache for the last 3 days. It is throbbing. She denies any rhinorrhea or nasal congestion. No fevers or chills. No stiff neck. She is concerned about meningitis as a coworker recently died from meningitis.  Past Medical History  Diagnosis Date  . Migraines    Past Surgical History  Procedure Laterality Date  . Tubal ligation     Family History  Problem Relation Age of Onset  . Hypertension Mother    History  Substance Use Topics  . Smoking status: Current Every Day Smoker -- 0.25 packs/day for 5 years    Types: Cigarettes  . Smokeless tobacco: Never Used  . Alcohol Use: Yes     Comment: Occasional   OB History    Gravida Para Term Preterm AB TAB SAB Ectopic Multiple Living   2 2 2       2      Review of Systems  Constitutional: Negative for fever and chills.  HENT: Positive for sore throat. Negative for congestion and rhinorrhea.   Respiratory: Positive for cough. Negative for shortness of breath.   Gastrointestinal: Positive for nausea (on Friday only). Negative for vomiting and diarrhea.  Musculoskeletal: Negative for myalgias, neck pain and neck stiffness.  Neurological: Positive for headaches.    Allergies  Review of patient's allergies indicates no known allergies.  Home Medications   Prior to Admission medications   Medication Sig Start Date End Date Taking? Authorizing  Provider  amoxicillin (AMOXIL) 500 MG capsule Take 1 capsule (500 mg total) by mouth 2 (two) times daily. 04/10/14   Melony Overly, MD  HYDROcodone-homatropine (HYCODAN) 5-1.5 MG/5ML syrup Take 5 mLs by mouth every 6 (six) hours as needed for cough. 04/10/14   Melony Overly, MD  metroNIDAZOLE (METROGEL VAGINAL) 0.75 % vaginal gel Place 1 Applicatorful vaginally 2 (two) times daily. For 5 days 08/01/13   Liam Graham, PA-C  Naproxen Sodium (ALEVE PO) Take by mouth.    Historical Provider, MD   BP 108/61 mmHg  Pulse 73  Temp(Src) 97.9 F (36.6 C) (Oral)  Resp 16  SpO2 100% Physical Exam  Constitutional: She is oriented to person, place, and time. She appears well-developed and well-nourished. No distress.  HENT:  Head: Normocephalic and atraumatic.  Nose: Nose normal.  Mouth/Throat: Mucous membranes are not dry. Oropharyngeal exudate and posterior oropharyngeal erythema present.  Neck: Neck supple.  Cardiovascular: Normal rate, regular rhythm and normal heart sounds.   No murmur heard. Pulmonary/Chest: Effort normal and breath sounds normal. No respiratory distress. She has no wheezes. She has no rales.  Lymphadenopathy:    She has cervical adenopathy (left submandibular).  Neurological: She is alert and oriented to person, place, and time.    ED Course  Procedures (including critical care time) Labs Review Labs Reviewed  POCT RAPID STREP A ([facility]RG CARE ONLY)    Imaging Review No results found.   MDM   1. Pharyngitis  Clinically she sounds like strep throat. We'll treat with amoxicillin for 10 days. Hycodan provided to use at night for cough. Recommended Chloraseptic spray for symptomatic relief. Reviewed warning signs to go to the emergency room as in after visit summary.    Melony Overly, MD 04/10/14 2103

## 2014-04-10 NOTE — ED Notes (Signed)
Cough, congestion, headache.  Denies fever.  C/o of throat swollen, symptoms for 3 days.  Reports feeling like side of jaw is locking or stiff

## 2014-04-10 NOTE — Discharge Instructions (Signed)
You likely have strep throat. Take Amoxicillin 1 pill twice a day for 10 days. Use the cough syrup at bedtime.  It has a narcotic in to so do not drive while taking this medicine. You can take a teaspoon of honey during the day to help with the cough. Use Chloraseptic spray for the sore throat. You should start to feel better in the next 2-3 days.  If you develop fevers, stiff neck or vomiting, go to the emergency room.

## 2014-04-12 LAB — CULTURE, GROUP A STREP

## 2014-05-12 ENCOUNTER — Encounter: Payer: Self-pay | Admitting: Family Medicine

## 2014-05-12 ENCOUNTER — Ambulatory Visit (INDEPENDENT_AMBULATORY_CARE_PROVIDER_SITE_OTHER): Payer: BC Managed Care – PPO | Admitting: Family Medicine

## 2014-05-12 VITALS — BP 112/68 | HR 78 | Temp 98.1°F | Ht 63.0 in | Wt 154.7 lb

## 2014-05-12 DIAGNOSIS — J069 Acute upper respiratory infection, unspecified: Secondary | ICD-10-CM

## 2014-05-12 MED ORDER — HYDROCODONE-HOMATROPINE 5-1.5 MG/5ML PO SYRP: 5.0000 mL | ORAL_SOLUTION | Freq: Three times a day (TID) | ORAL | Status: DC | PRN

## 2014-05-12 NOTE — Progress Notes (Signed)
HPI:  URI: -started: 3 days ago -symptoms:nasal congestion, sore throat, cough, body aches, chills, streak of blood in mucus -denies:fever, SOB, NVD, tooth pain -has tried: nothing -sick contacts/travel/risks: denies flu exposure, tick exposure or or Ebola risks -Hx of: allergies, hx resp illness last month - reports resolved for 2 weeks before this illness -daughter with cold for 1 week -fdlmp: Dec 9th 2015  ROS: See pertinent positives and negatives per HPI.  Past Medical History  Diagnosis Date  . Migraines     Past Surgical History  Procedure Laterality Date  . Tubal ligation      Family History  Problem Relation Age of Onset  . Hypertension Mother     History   Social History  . Marital Status: Single    Spouse Name: N/A    Number of Children: 2  . Years of Education: 15   Occupational History  . Phlebotomist    Social History Main Topics  . Smoking status: Current Every Day Smoker -- 0.25 packs/day for 5 years    Types: Cigarettes  . Smokeless tobacco: Never Used  . Alcohol Use: Yes     Comment: Occasional  . Drug Use: No  . Sexual Activity: Yes    Birth Control/ Protection: Surgical     Comment: Tuboligation   Other Topics Concern  . None   Social History Narrative   Born and Raised in [employer].   Lives with herself, their father and her children. Lives in a townhouse.   11 girls  and 4 girls    Fun: Works all the time   Diet - Needs to be better - lost 70 pounds - bad eating habits   Exercise: Does not currently exericse.    Denies religious beliefs effecting healthcare.     Current outpatient prescriptions: HYDROcodone-homatropine (HYCODAN) 5-1.5 MG/5ML syrup, Take 5 mLs by mouth every 8 (eight) hours as needed for cough., Disp: 120 mL, Rfl: 0  EXAM:  Filed Vitals:   05/12/14 1336  BP: 112/68  Pulse: 78  Temp: 98.1 F (36.7 C)    Body mass index is 27.41 kg/(m^2).  GENERAL: vitals reviewed and listed above, alert, oriented,  appears well hydrated and in no acute distress  HEENT: atraumatic, conjunttiva clear, no obvious abnormalities on inspection of external nose and ears, normal appearance of ear canals and TMs, clear nasal congestion, some irritation of nasal membranes, mild post oropharyngeal erythema with PND, no tonsillar edema or exudate, no sinus TTP  NECK: no obvious masses on inspection  LUNGS: clear to auscultation bilaterally, no wheezes, rales or rhonchi, good air movement  CV: HRRR, no peripheral edema  MS: moves all extremities without noticeable abnormality  PSYCH: pleasant and cooperative, no obvious depression or anxiety  ASSESSMENT AND PLAN:  Discussed the following assessment and plan:  Acute upper respiratory infection - Plan: DG Chest 2 View, HYDROcodone-homatropine (HYCODAN) 5-1.5 MG/5ML syrup  -given HPI and exam findings today, a serious infection or illness is unlikely. We discussed potential etiologies, with VURI being most likely, and advised supportive care and monitoring. We discussed treatment side effects, likely course, antibiotic misuse, transmission, and signs of developing a serious illness. -CXR given hemoptysis - though most likely for PND from nasal membranes -she requests cough medication she used last time she had an URI - risks discussed -of course, we advised to return or notify a doctor immediately if symptoms worsen or persist or new concerns arise.    Patient Instructions  INSTRUCTIONS FOR  UPPER RESPIRATORY INFECTION:  -plenty of rest and fluids  -go get the chest xray  -nasal saline wash 2-3 times daily (use prepackaged nasal saline or bottled/distilled water if making your own)   -can use AFRIN nasal spray for drainage and nasal congestion - but do NOT use longer then 3-4 days  -can use tylenol or ibuprofen as directed for aches and sorethroat  -in the winter time, using a humidifier at night is helpful (please follow cleaning instructions)  -if  you are taking a cough medication - use only as directed, may also try a teaspoon of honey to coat the throat and throat lozenges  -for sore throat, salt water gargles can help  -follow up if you have fevers, facial pain, tooth pain, difficulty breathing or are worsening or not getting better in 5-7 days      Demontre Padin R.

## 2014-05-12 NOTE — Progress Notes (Signed)
Pre visit review using our clinic review tool, if applicable. No additional management support is needed unless otherwise documented below in the visit note. 

## 2014-05-12 NOTE — Patient Instructions (Signed)
INSTRUCTIONS FOR UPPER RESPIRATORY INFECTION:  -plenty of rest and fluids  -go get the chest xray  -nasal saline wash 2-3 times daily (use prepackaged nasal saline or bottled/distilled water if making your own)   -can use AFRIN nasal spray for drainage and nasal congestion - but do NOT use longer then 3-4 days  -can use tylenol or ibuprofen as directed for aches and sorethroat  -in the winter time, using a humidifier at night is helpful (please follow cleaning instructions)  -if you are taking a cough medication - use only as directed, may also try a teaspoon of honey to coat the throat and throat lozenges  -for sore throat, salt water gargles can help  -follow up if you have fevers, facial pain, tooth pain, difficulty breathing or are worsening or not getting better in 5-7 days

## 2014-05-16 ENCOUNTER — Telehealth: Payer: Self-pay | Admitting: Family

## 2014-05-16 NOTE — Telephone Encounter (Signed)
emmi mailed  °

## 2014-07-29 ENCOUNTER — Emergency Department (INDEPENDENT_AMBULATORY_CARE_PROVIDER_SITE_OTHER)
Admission: EM | Admit: 2014-07-29 | Discharge: 2014-07-29 | Disposition: A | Discharge status: Home / Self Care | Payer: Self-pay | Source: Home / Self Care | Attending: Family Medicine | Admitting: Family Medicine

## 2014-07-29 ENCOUNTER — Emergency Department (INDEPENDENT_AMBULATORY_CARE_PROVIDER_SITE_OTHER): Payer: Self-pay

## 2014-07-29 ENCOUNTER — Encounter (HOSPITAL_COMMUNITY): Payer: Self-pay | Admitting: Emergency Medicine

## 2014-07-29 DIAGNOSIS — J111 Influenza due to unidentified influenza virus with other respiratory manifestations: Secondary | ICD-10-CM

## 2014-07-29 DIAGNOSIS — R69 Illness, unspecified: Principal | ICD-10-CM

## 2014-07-29 MED ORDER — HYDROCODONE-GUAIFENESIN 2.5-200 MG/5ML PO SOLN: 5.0000 mL | Freq: Four times a day (QID) | ORAL | Status: DC | PRN

## 2014-07-29 MED ORDER — OSELTAMIVIR PHOSPHATE 75 MG PO CAPS: 75.0000 mg | ORAL_CAPSULE | Freq: Two times a day (BID) | ORAL | Status: DC

## 2014-07-29 NOTE — ED Provider Notes (Signed)
CSN: [id number]     Arrival date & time 07/29/14  0912 History   First MD Initiated Contact with Patient 07/29/14 [phone]     Chief Complaint  Patient presents with  . Generalized Body Aches  . Chills  . Cough   (Consider location/radiation/quality/duration/timing/severity/associated sxs/prior Treatment) Patient is a 29 y.o. female presenting with cough. The history is provided by the patient.  Cough Cough characteristics:  Non-productive and harsh Severity:  Mild Onset quality:  Sudden Duration:  1 day Progression:  Worsening Chronicity:  New Smoker: yes   Relieved by:  None tried Worsened by:  Nothing tried Ineffective treatments:  None tried Associated symptoms: chills, fever, myalgias and rhinorrhea   Associated symptoms: no chest pain     Past Medical History  Diagnosis Date  . Migraines    Past Surgical History  Procedure Laterality Date  . Tubal ligation     Family History  Problem Relation Age of Onset  . Hypertension Mother    History  Substance Use Topics  . Smoking status: Current Every Day Smoker -- 0.25 packs/day for 5 years    Types: Cigarettes  . Smokeless tobacco: Never Used  . Alcohol Use: Yes     Comment: Occasional   OB History    Gravida Para Term Preterm AB TAB SAB Ectopic Multiple Living   2 2 2       2      Review of Systems  Constitutional: Positive for fever and chills.  HENT: Positive for congestion, postnasal drip and rhinorrhea.   Respiratory: Positive for cough.   Cardiovascular: Negative for chest pain.  Musculoskeletal: Positive for myalgias.    Allergies  Review of patient's allergies indicates no known allergies.  Home Medications   Prior to Admission medications   Medication Sig Start Date End Date Taking? Authorizing Provider  HYDROcodone-homatropine (HYCODAN) 5-1.5 MG/5ML syrup Take 5 mLs by mouth every 8 (eight) hours as needed for cough. 05/12/14   Lucretia Kern, DO  oseltamivir (TAMIFLU) 75 MG capsule Take 1 capsule  (75 mg total) by mouth every 12 (twelve) hours. Take all of medication. 07/29/14   Billy Fischer, MD   BP 119/71 mmHg  Pulse 64  Temp(Src) 99.6 F (37.6 C) (Oral)  Resp 16  SpO2 99%  LMP 07/25/2014 (Exact Date) Physical Exam  Constitutional: She is oriented to person, place, and time. She appears well-developed and well-nourished. No distress.  HENT:  Head: Normocephalic.  Right Ear: External ear normal.  Mouth/Throat: Oropharynx is clear and moist.  Eyes: Conjunctivae are normal. Pupils are equal, round, and reactive to light.  Neck: Normal range of motion. Neck supple.  Cardiovascular: Normal rate, regular rhythm, normal heart sounds and intact distal pulses.   Pulmonary/Chest: Effort normal and breath sounds normal.  Neurological: She is alert and oriented to person, place, and time.  Skin: Skin is warm and dry.  Nursing note and vitals reviewed.   ED Course  Procedures (including critical care time) Labs Review Labs Reviewed - No data to display  Imaging Review Dg Chest 2 View  07/29/2014   CLINICAL DATA:  Cough and fever for 3 days. Initial encounter. Cigarette smoker.  EXAM: CHEST  2 VIEW  COMPARISON:  None.  FINDINGS: Cardiopericardial silhouette within normal limits. Mediastinal contours normal. Trachea midline. No airspace disease or effusion.  IMPRESSION: No active cardiopulmonary disease.   Electronically Signed   By: Dereck Ligas M.D.   On: 07/29/2014 11:04    X-rays reviewed and  report per radiologist.  MDM   1. Influenza-like illness    Pt is [facility] and had flu shot this yr.   Billy Fischer, MD 07/29/14 [phone]

## 2014-07-29 NOTE — ED Notes (Signed)
Pt states that she has had body aches, chills and cough since yesterday. Pt states that she had a temperature of 101.2 last night. Pt is in no acute distress at this time.

## 2014-11-15 ENCOUNTER — Other Ambulatory Visit: Payer: Self-pay

## 2014-11-16 ENCOUNTER — Ambulatory Visit (INDEPENDENT_AMBULATORY_CARE_PROVIDER_SITE_OTHER): Payer: BLUE CROSS/BLUE SHIELD | Admitting: Family

## 2014-11-16 ENCOUNTER — Encounter: Payer: Self-pay | Admitting: Family

## 2014-11-16 VITALS — BP 108/68 | HR 66 | Temp 98.3°F | Resp 18 | Ht 63.0 in | Wt 160.4 lb

## 2014-11-16 DIAGNOSIS — J069 Acute upper respiratory infection, unspecified: Secondary | ICD-10-CM | POA: Diagnosis not present

## 2014-11-16 LAB — CYTOLOGY - PAP

## 2014-11-16 MED ORDER — HYDROCODONE-HOMATROPINE 5-1.5 MG/5ML PO SYRP: 5.0000 mL | ORAL_SOLUTION | Freq: Three times a day (TID) | ORAL | Status: DC | PRN

## 2014-11-16 MED ORDER — AMOXICILLIN 500 MG PO CAPS: 500.0000 mg | ORAL_CAPSULE | Freq: Two times a day (BID) | ORAL | Status: DC

## 2014-11-16 NOTE — Progress Notes (Signed)
Pre visit review using our clinic review tool, if applicable. No additional management support is needed unless otherwise documented below in the visit note. 

## 2014-11-16 NOTE — Patient Instructions (Signed)
Thank you for choosing [facility].  Summary/Instructions:  Your prescription(s) have been submitted to your pharmacy or been printed and provided for you. Please take as directed and contact our office if you believe you are having problem(s) with the medication(s) or have any questions.  If your symptoms worsen or fail to improve, please contact our office for further instruction, or in case of emergency go directly to the emergency room at the closest medical facility.   General Recommendations:    Please drink plenty of fluids.  Get plenty of rest   Sleep in humidified air  Use saline nasal sprays  Netti pot   OTC Medications:  Decongestants - helps relieve congestion   Flonase (generic fluticasone) or Nasacort (generic triamcinolone) - please make sure to use the "cross-over" technique at a 45 degree angle towards the opposite eye as opposed to straight up the nasal passageway.   Sudafed (generic pseudoephedrine - Note this is the one that is available behind the pharmacy counter); Products with phenylephrine (-PE) may also be used but is often not as effective as pseudoephedrine.   If you have HIGH BLOOD PRESSURE - Coricidin HBP; AVOID any product that is -D as this contains pseudoephedrine which may increase your blood pressure.  Afrin (oxymetazoline) every 6-8 hours for up to 3 days.   Allergies - helps relieve runny nose, itchy eyes and sneezing   Claritin (generic loratidine), Allegra (fexofenidine), or Zyrtec (generic cyrterizine) for runny nose. These medications should not cause drowsiness.  Note - Benadryl (generic diphenhydramine) may be used however may cause drowsiness  Cough -   Delsym or Robitussin (generic dextromethorphan)  Expectorants - helps loosen mucus to ease removal   Mucinex (generic guaifenesin) as directed on the package.  Headaches / General Aches   Tylenol (generic acetaminophen) - DO NOT EXCEED 3 grams (3,000 mg) in a 24  hour time period  Advil/Motrin (generic ibuprofen)   Sore Throat -   Salt water gargle   Chloraseptic (generic benzocaine) spray or lozenges / Sucrets (generic dyclonine)      

## 2014-11-16 NOTE — Assessment & Plan Note (Signed)
Symptoms and exam consistent with acute upper respiratory infection. Given no improvements start amoxicillin. Start Hycodan as needed for cough and sleep. Continue over-the-counter medications as needed for symptom relief and supportive care. Follow-up if symptoms worsen or fail to improve.

## 2014-11-16 NOTE — Progress Notes (Signed)
   Subjective:    Patient ID: Mary Harrington, female    DOB: 1986-02-17, 29 y.o.   MRN: [id number]  Chief Complaint  Patient presents with  . Cough    x4 days, off and on headaches, swollen tonsils, productive cough, no sore throat    HPI:  Mary Harrington is a 29 y.o. female with a PMH of fatigue, dizziness and depressed mood who presents today for an acute office visit.   Associated symptoms of swollen tonsils, productive cough and waxing and waning headache has been going on for approximately 4 days. Timing of symptoms are worse at night. Modifying factors include Claritin, albuterol inhaler and a nasal spray which has not helped very much. Indicates that she originally got worse, but has not seen any improvements since the onset.    No Known Allergies   No current outpatient prescriptions on file prior to visit.   No current facility-administered medications on file prior to visit.    Review of Systems  Constitutional: Negative for fever and chills.  HENT: Positive for congestion and sinus pressure. Negative for ear pain and sore throat.   Respiratory: Positive for cough. Negative for chest tightness and shortness of breath.   Neurological: Positive for headaches.      Objective:    BP 108/68 mmHg  Pulse 66  Temp(Src) 98.3 F (36.8 C) (Oral)  Resp 18  Ht 5\' 3"  (1.6 m)  Wt 160 lb 6.4 oz (72.757 kg)  BMI 28.42 kg/m2  SpO2 99% Nursing note and vital signs reviewed.  Physical Exam  Constitutional: She is oriented to person, place, and time. She appears well-developed and well-nourished. No distress.  HENT:  Right Ear: Hearing, tympanic membrane, external ear and ear canal normal.  Left Ear: Hearing, tympanic membrane, external ear and ear canal normal.  Nose: Right sinus exhibits maxillary sinus tenderness. Right sinus exhibits no frontal sinus tenderness. Left sinus exhibits maxillary sinus tenderness. Left sinus exhibits no frontal sinus tenderness.    Mouth/Throat: Uvula is midline and mucous membranes are normal. Posterior oropharyngeal erythema present. No oropharyngeal exudate, posterior oropharyngeal edema or tonsillar abscesses.  Cardiovascular: Normal rate, regular rhythm, normal heart sounds and intact distal pulses.   Pulmonary/Chest: Effort normal and breath sounds normal.  Neurological: She is alert and oriented to person, place, and time.  Skin: Skin is warm and dry.  Psychiatric: She has a normal mood and affect. Her behavior is normal. Judgment and thought content normal.       Assessment & Plan:   Problem List Items Addressed This Visit      Respiratory   Acute upper respiratory infection - Primary    Symptoms and exam consistent with acute upper respiratory infection. Given no improvements start amoxicillin. Start Hycodan as needed for cough and sleep. Continue over-the-counter medications as needed for symptom relief and supportive care. Follow-up if symptoms worsen or fail to improve.      Relevant Medications   HYDROcodone-homatropine (HYCODAN) 5-1.5 MG/5ML syrup   amoxicillin (AMOXIL) 500 MG capsule

## 2015-03-01 ENCOUNTER — Encounter: Payer: Self-pay | Admitting: Adult Health

## 2015-03-01 ENCOUNTER — Ambulatory Visit (INDEPENDENT_AMBULATORY_CARE_PROVIDER_SITE_OTHER): Payer: BLUE CROSS/BLUE SHIELD | Admitting: Adult Health

## 2015-03-01 VITALS — BP 108/70 | HR 64 | Temp 98.3°F | Wt 164.0 lb

## 2015-03-01 DIAGNOSIS — J029 Acute pharyngitis, unspecified: Secondary | ICD-10-CM

## 2015-03-01 DIAGNOSIS — G43011 Migraine without aura, intractable, with status migrainosus: Secondary | ICD-10-CM

## 2015-03-01 MED ORDER — PENICILLIN G BENZATHINE & PROC 1200000 UNIT/2ML IM SUSP
1.2000 10*6.[IU] | Freq: Once | INTRAMUSCULAR | Status: AC
  Administered 2015-03-01: 1.2 10*6.[IU] via INTRAMUSCULAR

## 2015-03-01 MED ORDER — HYDROCODONE-HOMATROPINE 5-1.5 MG/5ML PO SYRP: 5.0000 mL | ORAL_SOLUTION | Freq: Three times a day (TID) | ORAL | Status: DC | PRN

## 2015-03-01 NOTE — Addendum Note (Signed)
Addended by: Colleen Can on: 03/01/2015 10:39 AM   Modules accepted: Orders

## 2015-03-01 NOTE — Progress Notes (Signed)
Subjective:    Patient ID: Mary Harrington, female    DOB: Sep 22, 1985, 29 y.o.   MRN: [id number]  HPI  Last Friday morning when she work up from work she had chills and headache. She took Tylenol and Claritin. The headache went away for one day and then returned. The headache is unilateral on left side, above her eye. The pain is sharp. The tylenol is not helping currently.   She also endorses swollen tonsils with white exudate for the last three days. Has pain with swallowing and cough. She has had a lot of issues with her tonsils over the years and would like referral to ENT to discuss having her tonsils removed.   No photophobia. She does have a history of migraines but has not had any since high school.    Review of Systems  Constitutional: Positive for fever, chills, appetite change and fatigue.  HENT: Positive for sore throat and trouble swallowing. Negative for ear discharge, ear pain, postnasal drip, rhinorrhea and sinus pressure.   Eyes: Negative.  Negative for photophobia, pain, discharge and itching.  Respiratory: Positive for cough. Negative for shortness of breath.   Cardiovascular: Negative.   Gastrointestinal: Positive for nausea. Negative for diarrhea and constipation.  Neurological: Positive for headaches. Negative for dizziness and weakness.  Hematological: Positive for adenopathy.  All other systems reviewed and are negative.  Past Medical History  Diagnosis Date  . Migraines     Social History   Social History  . Marital Status: Single    Spouse Name: N/A  . Number of Children: 2  . Years of Education: 15   Occupational History  . Phlebotomist    Social History Main Topics  . Smoking status: Current Every Day Smoker -- 0.25 packs/day for 5 years    Types: Cigarettes  . Smokeless tobacco: Never Used  . Alcohol Use: Yes     Comment: Occasional  . Drug Use: No  . Sexual Activity: Yes    Birth Control/ Protection: Surgical     Comment: Tuboligation     Other Topics Concern  . Not on file   Social History Narrative   Born and Raised in [place].   Lives with herself, their father and her children. Lives in a townhouse.   11 girls  and 4 girls    Fun: Works all the time   Diet - Needs to be better - lost 70 pounds - bad eating habits   Exercise: Does not currently exericse.    Denies religious beliefs effecting healthcare.     Past Surgical History  Procedure Laterality Date  . Tubal ligation      Family History  Problem Relation Age of Onset  . Hypertension Mother     No Known Allergies  No current outpatient prescriptions on file prior to visit.   No current facility-administered medications on file prior to visit.    BP 108/70 mmHg  Pulse 64  Temp(Src) 98.3 F (36.8 C)  Wt 164 lb (74.39 kg)       Objective:   Physical Exam  Constitutional: She is oriented to person, place, and time. She appears well-developed and well-nourished. No distress.  HENT:  Head: Normocephalic and atraumatic.  Right Ear: External ear normal.  Left Ear: External ear normal.  Nose: Nose normal.  Mouth/Throat: Oropharyngeal exudate present.  Tonsils red and swollen  Eyes: Conjunctivae and EOM are normal. Pupils are equal, round, and reactive to light. Right eye exhibits no discharge. Left  eye exhibits no discharge.  Neck: Normal range of motion. Neck supple.  Cardiovascular: Normal rate, regular rhythm, normal heart sounds and intact distal pulses.  Exam reveals no gallop and no friction rub.   No murmur heard. Pulmonary/Chest: Effort normal and breath sounds normal. No respiratory distress. She has no wheezes. She has no rales. She exhibits no tenderness.  Lymphadenopathy:    She has cervical adenopathy.  Neurological: She is alert and oriented to person, place, and time.  Skin: Skin is warm and dry. No rash noted. She is not diaphoretic. No erythema. No pallor.  Psychiatric: She has a normal mood and affect. Her behavior is  normal. Judgment and thought content normal.  Nursing note and vitals reviewed.     Assessment & Plan:  1. Intractable migraine without aura and with status migrainosus - Would like to try Excedrin Migraine before starting prescription medications.  - Follow up if no change in headache or if symptoms worsen.  2. Sore throat - Centor Score 3 - POC Rapid Strep A- Negative - penicillin g procaine-penicillin g benzathine (BICILLIN-CR) injection 600000-600000 units; Inject 2 mLs (1.2 Million Units total) into the muscle once. Will treat on symptoms and exam.  - Ambulatory referral to ENT- She was advised that they would probably not want to take her tonsils out. She would like to see them anyways.  - Follow up if symptoms do not improve.

## 2015-03-01 NOTE — Patient Instructions (Signed)
It was nice meeting you today.   Your strep test came back negative but I will send a culture.   For the cough use the cough syrup during the night. You can use Mucinex cough during the day.   Use Excedrin Migraine for the headache  Follow up if not feeling better.

## 2015-03-03 LAB — CULTURE, GROUP A STREP: Organism ID, Bacteria: NORMAL

## 2015-04-28 ENCOUNTER — Telehealth: Payer: Self-pay

## 2015-04-28 NOTE — Telephone Encounter (Signed)
LVM for pt to call back as soon as possible.   RE: Mary Harrington for 2016 season

## 2015-07-19 ENCOUNTER — Emergency Department (INDEPENDENT_AMBULATORY_CARE_PROVIDER_SITE_OTHER)
Admission: EM | Admit: 2015-07-19 | Discharge: 2015-07-19 | Disposition: A | Discharge status: Home / Self Care | Payer: Self-pay | Source: Home / Self Care | Attending: Family Medicine | Admitting: Family Medicine

## 2015-07-19 ENCOUNTER — Encounter (HOSPITAL_COMMUNITY): Payer: Self-pay | Admitting: *Deleted

## 2015-07-19 DIAGNOSIS — S39012A Strain of muscle, fascia and tendon of lower back, initial encounter: Secondary | ICD-10-CM

## 2015-07-19 DIAGNOSIS — G43009 Migraine without aura, not intractable, without status migrainosus: Secondary | ICD-10-CM

## 2015-07-19 MED ORDER — METHOCARBAMOL 500 MG PO TABS: 500.0000 mg | ORAL_TABLET | Freq: Four times a day (QID) | ORAL | Status: DC | PRN

## 2015-07-19 MED ORDER — IBUPROFEN 600 MG PO TABS: 600.0000 mg | ORAL_TABLET | Freq: Four times a day (QID) | ORAL | Status: DC | PRN
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
## 2015-07-19 NOTE — ED Notes (Signed)
Patient and 2 minor children being seen in the same treatment room, same provider.
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
## 2015-09-25 ENCOUNTER — Inpatient Hospital Stay (HOSPITAL_COMMUNITY)
Admission: AD | Admit: 2015-09-25 | Discharge: 2015-09-26 | Disposition: A | Discharge status: Home / Self Care | Payer: BLUE CROSS/BLUE SHIELD | Source: Ambulatory Visit | Attending: Family Medicine | Admitting: Family Medicine

## 2015-09-25 DIAGNOSIS — R51 Headache: Secondary | ICD-10-CM | POA: Insufficient documentation

## 2015-09-25 DIAGNOSIS — R112 Nausea with vomiting, unspecified: Secondary | ICD-10-CM | POA: Insufficient documentation

## 2015-09-25 DIAGNOSIS — N938 Other specified abnormal uterine and vaginal bleeding: Secondary | ICD-10-CM | POA: Insufficient documentation

## 2015-09-25 DIAGNOSIS — R102 Pelvic and perineal pain: Secondary | ICD-10-CM | POA: Insufficient documentation

## 2015-09-25 DIAGNOSIS — F17210 Nicotine dependence, cigarettes, uncomplicated: Secondary | ICD-10-CM | POA: Insufficient documentation

## 2015-09-25 DIAGNOSIS — N939 Abnormal uterine and vaginal bleeding, unspecified: Secondary | ICD-10-CM

## 2015-09-25 LAB — URINALYSIS, ROUTINE W REFLEX MICROSCOPIC
BILIRUBIN URINE: NEGATIVE
GLUCOSE, UA: NEGATIVE mg/dL
KETONES UR: NEGATIVE mg/dL
Leukocytes, UA: NEGATIVE
Nitrite: NEGATIVE
PH: 5.5 (ref 5.0–8.0)
Protein, ur: NEGATIVE mg/dL
SPECIFIC GRAVITY, URINE: 1.025 (ref 1.005–1.030)

## 2015-09-25 LAB — URINE MICROSCOPIC-ADD ON

## 2015-09-25 LAB — POCT PREGNANCY, URINE: Preg Test, Ur: NEGATIVE

## 2015-09-25 NOTE — MAU Note (Signed)
Pt states that she had a tubaligation 6 years ago. Stated that she started having some back pain, abdominal pain and some vaginal spotting that started Sunday. Is also having some nausea.   y

## 2015-09-26 DIAGNOSIS — N939 Abnormal uterine and vaginal bleeding, unspecified: Secondary | ICD-10-CM

## 2015-09-26 LAB — RPR: RPR Ser Ql: NONREACTIVE

## 2015-09-26 LAB — CBC
HCT: 33.6 % — ABNORMAL LOW (ref 36.0–46.0)
Hemoglobin: 11.4 g/dL — ABNORMAL LOW (ref 12.0–15.0)
MCH: 27.5 pg (ref 26.0–34.0)
MCHC: 33.9 g/dL (ref 30.0–36.0)
MCV: 81.2 fL (ref 78.0–100.0)
PLATELETS: 205 10*3/uL (ref 150–400)
RBC: 4.14 MIL/uL (ref 3.87–5.11)
RDW: 13.2 % (ref 11.5–15.5)
WBC: 4.9 10*3/uL (ref 4.0–10.5)

## 2015-09-26 LAB — WET PREP, GENITAL
CLUE CELLS WET PREP: NONE SEEN
Sperm: NONE SEEN
TRICH WET PREP: NONE SEEN
Yeast Wet Prep HPF POC: NONE SEEN

## 2015-09-26 LAB — HIV ANTIBODY (ROUTINE TESTING W REFLEX): HIV Screen 4th Generation wRfx: NONREACTIVE

## 2015-09-26 MED ORDER — OXYCODONE-ACETAMINOPHEN 5-325 MG PO TABS: 1.0000 | ORAL_TABLET | Freq: Four times a day (QID) | ORAL | Status: DC | PRN

## 2015-09-26 MED ORDER — KETOROLAC TROMETHAMINE 60 MG/2ML IM SOLN
60.0000 mg | Freq: Once | INTRAMUSCULAR | Status: AC
  Administered 2015-09-26: 60 mg via INTRAMUSCULAR
  Filled 2015-09-26: qty 2

## 2015-09-26 NOTE — Discharge Instructions (Signed)
Abnormal Uterine Bleeding °Abnormal uterine bleeding means bleeding from the vagina that is not your normal menstrual period. This can be: °· Bleeding or spotting between periods. °· Bleeding after sex (sexual intercourse). °· Bleeding that is heavier or more than normal. °· Periods that last longer than usual. °· Bleeding after menopause. °There are many problems that may cause this. Treatment will depend on the cause of the bleeding. Any kind of bleeding that is not normal should be reviewed by your doctor.  °HOME CARE °Watch your condition for any changes. These actions may lessen any discomfort you are having: °· Do not use tampons or douches as told by your doctor. °· Change your pads often. °You should get regular pelvic exams and Pap tests. Keep all appointments for tests as told by your doctor. °GET HELP IF: °· You are bleeding for more than 1 week. °· You feel dizzy at times. °GET HELP RIGHT AWAY IF:  °· You pass out. °· You have to change pads every 15 to 30 minutes. °· You have belly pain. °· You have a fever. °· You become sweaty or weak. °· You are passing large blood clots from the vagina. °· You feel sick to your stomach (nauseous) and throw up (vomit). °MAKE SURE YOU: °· Understand these instructions. °· Will watch your condition. °· Will get help right away if you are not doing well or get worse. °  °This information is not intended to replace advice given to you by your health care provider. Make sure you discuss any questions you have with your health care provider. °  °Document Released: 03/09/2009 Document Revised: 05/17/2013 Document Reviewed: 12/09/2012 °Elsevier Interactive Patient Education ©2016 [employer] ° °
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
## 2015-09-27 LAB — GC/CHLAMYDIA PROBE AMP (~~LOC~~) NOT AT ARMC
CHLAMYDIA, DNA PROBE: NEGATIVE
NEISSERIA GONORRHEA: NEGATIVE

## 2015-10-03 ENCOUNTER — Ambulatory Visit (HOSPITAL_COMMUNITY): Admission: RE | Admit: 2015-10-03 | Payer: BLUE CROSS/BLUE SHIELD | Source: Ambulatory Visit

## 2015-10-29 ENCOUNTER — Encounter: Payer: BLUE CROSS/BLUE SHIELD | Admitting: Family Medicine

## 2015-12-28 ENCOUNTER — Ambulatory Visit (HOSPITAL_COMMUNITY)
Admission: EM | Admit: 2015-12-28 | Discharge: 2015-12-28 | Disposition: A | Discharge status: Home / Self Care | Payer: Self-pay | Attending: Emergency Medicine | Admitting: Emergency Medicine

## 2015-12-28 ENCOUNTER — Encounter (HOSPITAL_COMMUNITY): Payer: Self-pay | Admitting: Emergency Medicine

## 2015-12-28 DIAGNOSIS — J40 Bronchitis, not specified as acute or chronic: Secondary | ICD-10-CM

## 2015-12-28 DIAGNOSIS — Q139 Congenital malformation of anterior segment of eye, unspecified: Secondary | ICD-10-CM

## 2015-12-28 MED ORDER — PREDNISONE 50 MG PO TABS: ORAL_TABLET | ORAL | 0 refills | Status: DC

## 2015-12-28 MED ORDER — AMOXICILLIN 500 MG PO CAPS: 500.0000 mg | ORAL_CAPSULE | Freq: Three times a day (TID) | ORAL | 0 refills | Status: DC

## 2015-12-28 MED ORDER — HYDROCODONE-HOMATROPINE 5-1.5 MG/5ML PO SYRP: 5.0000 mL | ORAL_SOLUTION | Freq: Four times a day (QID) | ORAL | 0 refills | Status: DC | PRN

## 2015-12-28 NOTE — ED Provider Notes (Signed)
[place]    CSN: [id number] Arrival date & time: 12/28/15  1359  First Provider Contact:  First MD Initiated Contact with Patient 12/28/15 1437        History   Chief Complaint Chief Complaint  Patient presents with  . Eye Problem    HPI Mary Harrington is a 30 y.o. female.   She is a 30 year old woman here for evaluation of left eye issue and cough. She states about 2 weeks ago she developed head cold with nasal congestion, rhinorrhea, cough, and left eye irritation. She states the congestion has improved, but she continues to have a productive cough and headaches. She used polymyxin eyedrops for the last 5 days and the redness and matting of her left eye has resolved, but now she has little blisters on the right of her eye. She denies any pain from these blisters. She denies any wheezing or shortness of breath. Cough keeps her up at night. No fevers.    Past Medical History:  Diagnosis Date  . Migraines     Patient Active Problem List   Diagnosis Date Noted  . Acute upper respiratory infection 11/16/2014  . Fatigue 03/13/2014  . Dizziness 03/13/2014  . Depressed mood 03/13/2014  . Breast lump on right side at 2 o'clock position 03/15/2013    Past Surgical History:  Procedure Laterality Date  . TUBAL LIGATION      OB History    Gravida Para Term Preterm AB Living   2 2 2     2    SAB TAB Ectopic Multiple Live Births                   Home Medications    Prior to Admission medications   Medication Sig Start Date End Date Taking? Authorizing Provider  amoxicillin (AMOXIL) 500 MG capsule Take 1 capsule (500 mg total) by mouth 3 (three) times daily. 12/28/15   Melony Overly, MD  HYDROcodone-homatropine (HYCODAN) 5-1.5 MG/5ML syrup Take 5 mLs by mouth every 6 (six) hours as needed for cough. 12/28/15   Melony Overly, MD  ibuprofen (ADVIL,MOTRIN) 600 MG tablet Take 1 tablet (600 mg total) by mouth every 6 (six) hours as needed. 07/19/15   Waldemar Dickens,  MD  methocarbamol (ROBAXIN) 500 MG tablet Take 1-2 tablets (500-1,000 mg total) by mouth every 6 (six) hours as needed for muscle spasms. 07/19/15   Waldemar Dickens, MD  oxyCODONE-acetaminophen (PERCOCET/ROXICET) 5-325 MG tablet Take 1-2 tablets by mouth every 6 (six) hours as needed for severe pain. 09/26/15   Luvenia Redden, PA-C  predniSONE (DELTASONE) 50 MG tablet Take 1 pill daily for 5 days. 12/28/15   Melony Overly, MD    Family History Family History  Problem Relation Age of Onset  . Hypertension Mother     Social History Social History  Substance Use Topics  . Smoking status: Current Every Day Smoker    Packs/day: 0.25    Years: 5.00    Types: Cigarettes  . Smokeless tobacco: Never Used  . Alcohol use No     Comment: Occasional     Allergies   Review of patient's allergies indicates no known allergies.   Review of Systems Review of Systems  Constitutional: Negative for appetite change and fever.  HENT: Negative for congestion, ear pain, rhinorrhea and sore throat.   Eyes:       Blisters on sclera  Respiratory: Positive for cough. Negative for shortness of breath  and wheezing.   Gastrointestinal: Negative for vomiting.  Neurological: Positive for headaches.     Physical Exam Triage Vital Signs ED Triage Vitals  Enc Vitals Group     BP 12/28/15 1447 117/70     Pulse Rate 12/28/15 1447 68     Resp 12/28/15 1447 16     Temp 12/28/15 1447 98.4 F (36.9 C)     Temp Source 12/28/15 1447 Oral     SpO2 12/28/15 1447 100 %     Weight --      Height --      Head Circumference --      Peak Flow --      Pain Score 12/28/15 1448 8     Pain Loc --      Pain Edu? --      Excl. in [place]? --    No data found.   Updated Vital Signs BP 117/70   Pulse 68   Temp 98.4 F (36.9 C) (Oral)   Resp 16   LMP 12/27/2015   SpO2 100%   Visual Acuity Right Eye Distance:   Left Eye Distance:   Bilateral Distance:    Right Eye Near:   Left Eye Near:    Bilateral Near:       Physical Exam  Constitutional: She is oriented to person, place, and time. She appears well-developed and well-nourished. No distress.  HENT:  Nose: Nose normal.  Mouth/Throat: Oropharynx is clear and moist. No oropharyngeal exudate.  TMs normal bilaterally  Eyes: Conjunctivae are normal. Right eye exhibits no discharge. Left eye exhibits no discharge.  She has 2 small blisters on the sclera of her left eye. One is located in the inferior medial quadrant and one in the inferior lateral quadrant.  Neck: Neck supple.  Cardiovascular: Normal rate, regular rhythm and normal heart sounds.  Exam reveals no gallop.   No murmur heard. Pulmonary/Chest: Effort normal and breath sounds normal. She has no wheezes. She has no rales.  Neurological: She is alert and oriented to person, place, and time.     UC Treatments / Results  Labs (all labs ordered are listed, but only abnormal results are displayed) Labs Reviewed - No data to display  EKG  EKG Interpretation None       Radiology No results found.  Procedures Procedures (including critical care time)  Medications Ordered in UC Medications - No data to display   Initial Impression / Assessment and Plan / UC Course  I have reviewed the triage vital signs and the nursing notes.  Pertinent labs & imaging results that were available during my care of the patient were reviewed by me and considered in my medical decision making (see chart for details).  Clinical Course    Given duration of symptoms, will treat with amoxicillin and prednisone. Hycodan as needed for cough. Recommended stopping the polymyxin eyedrops as there is no current sign of conjunctivitis. I expect the blisters will resolve over time. Follow-up as needed.  Final Clinical Impressions(s) / UC Diagnoses   Final diagnoses:  Bronchitis  Anomaly of sclera    New Prescriptions New Prescriptions   AMOXICILLIN (AMOXIL) 500 MG CAPSULE    Take 1 capsule (500  mg total) by mouth 3 (three) times daily.   HYDROCODONE-HOMATROPINE (HYCODAN) 5-1.5 MG/5ML SYRUP    Take 5 mLs by mouth every 6 (six) hours as needed for cough.   PREDNISONE (DELTASONE) 50 MG TABLET    Take 1 pill daily for 5  days.     Melony Overly, MD 12/28/15 [phone]

## 2015-12-28 NOTE — ED Triage Notes (Signed)
PT was having eye irritation and started to use polymixin drops. PT now has clear pockets on sclera. PT denies allergy to sulfa. PT also reports congestion and cough for 2 weeks.

## 2015-12-28 NOTE — Discharge Instructions (Signed)
We are going to treat you for bronchitis with amoxicillin and prednisone. Use the Hycodan at bedtime to help with cough and headache.  There is no sign of pinkeye currently. Please stop the eyedrops. You have some small blebs on your sclera (the white part of your eye). I suspect these will resolve on their own over time.  Follow-up as needed.

## 2016-04-25 ENCOUNTER — Ambulatory Visit (HOSPITAL_COMMUNITY)
Admission: EM | Admit: 2016-04-25 | Discharge: 2016-04-25 | Disposition: A | Discharge status: Home / Self Care | Payer: 59 | Attending: Family Medicine | Admitting: Family Medicine

## 2016-04-25 ENCOUNTER — Encounter (HOSPITAL_COMMUNITY): Payer: Self-pay | Admitting: Emergency Medicine

## 2016-04-25 DIAGNOSIS — J40 Bronchitis, not specified as acute or chronic: Secondary | ICD-10-CM

## 2016-04-25 MED ORDER — GUAIFENESIN-CODEINE 100-10 MG/5ML PO SYRP: 10.0000 mL | ORAL_SOLUTION | Freq: Four times a day (QID) | ORAL | 0 refills | Status: DC | PRN

## 2016-04-25 MED ORDER — DOXYCYCLINE HYCLATE 100 MG PO CAPS: 100.0000 mg | ORAL_CAPSULE | Freq: Two times a day (BID) | ORAL | 0 refills | Status: DC

## 2016-04-25 NOTE — Discharge Instructions (Signed)
Take all of medicine, drink lots of fluids,  see your doctor if further problems or return.

## 2016-04-25 NOTE — ED Provider Notes (Signed)
[place]    CSN: [id number] Arrival date & time: 04/25/16  1936     History   Chief Complaint Chief Complaint  Patient presents with  . URI    HPI Mary Harrington is a 30 y.o. female.   The history is provided by the patient.  URI  Presenting symptoms: congestion, cough and rhinorrhea   Presenting symptoms: no fever   Severity:  Mild Onset quality:  Gradual Duration:  5 days Progression:  Unchanged Chronicity:  New Relieved by:  None tried Worsened by:  Nothing Ineffective treatments:  None tried Associated symptoms: no wheezing   Risk factors: no sick contacts     Past Medical History:  Diagnosis Date  . Migraines     Patient Active Problem List   Diagnosis Date Noted  . Acute upper respiratory infection 11/16/2014  . Fatigue 03/13/2014  . Dizziness 03/13/2014  . Depressed mood 03/13/2014  . Breast lump on right side at 2 o'clock position 03/15/2013    Past Surgical History:  Procedure Laterality Date  . TUBAL LIGATION      OB History    Gravida Para Term Preterm AB Living   2 2 2     2    SAB TAB Ectopic Multiple Live Births                   Home Medications    Prior to Admission medications   Medication Sig Start Date End Date Taking? Authorizing Provider  amoxicillin (AMOXIL) 500 MG capsule Take 1 capsule (500 mg total) by mouth 3 (three) times daily. 12/28/15   Melony Overly, MD  HYDROcodone-homatropine (HYCODAN) 5-1.5 MG/5ML syrup Take 5 mLs by mouth every 6 (six) hours as needed for cough. 12/28/15   Melony Overly, MD  ibuprofen (ADVIL,MOTRIN) 600 MG tablet Take 1 tablet (600 mg total) by mouth every 6 (six) hours as needed. 07/19/15   Waldemar Dickens, MD  methocarbamol (ROBAXIN) 500 MG tablet Take 1-2 tablets (500-1,000 mg total) by mouth every 6 (six) hours as needed for muscle spasms. 07/19/15   Waldemar Dickens, MD  oxyCODONE-acetaminophen (PERCOCET/ROXICET) 5-325 MG tablet Take 1-2 tablets by mouth every 6 (six) hours as needed  for severe pain. 09/26/15   Luvenia Redden, PA-C  predniSONE (DELTASONE) 50 MG tablet Take 1 pill daily for 5 days. 12/28/15   Melony Overly, MD    Family History Family History  Problem Relation Age of Onset  . Hypertension Mother     Social History Social History  Substance Use Topics  . Smoking status: Current Every Day Smoker    Packs/day: 0.25    Years: 5.00    Types: Cigarettes  . Smokeless tobacco: Never Used  . Alcohol use No     Comment: Occasional     Allergies   Patient has no known allergies.   Review of Systems Review of Systems  Constitutional: Negative.  Negative for fever.  HENT: Positive for congestion and rhinorrhea. Negative for postnasal drip.   Respiratory: Positive for cough. Negative for shortness of breath and wheezing.   Cardiovascular: Negative.   Gastrointestinal: Negative.   All other systems reviewed and are negative.    Physical Exam Triage Vital Signs ED Triage Vitals  Enc Vitals Group     BP 04/25/16 1945 93/63     Pulse Rate 04/25/16 1945 89     Resp 04/25/16 1945 14     Temp 04/25/16 1945 98.3 F (36.8  C)     Temp Source 04/25/16 1945 Oral     SpO2 04/25/16 1945 100 %     Weight --      Height --      Head Circumference --      Peak Flow --      Pain Score 04/25/16 1947 8     Pain Loc --      Pain Edu? --      Excl. in [place]? --    No data found.   Updated Vital Signs BP 93/63 (BP Location: Left Arm)   Pulse 89   Temp 98.3 F (36.8 C) (Oral)   Resp 14   LMP 04/25/2016   SpO2 100%   Visual Acuity Right Eye Distance:   Left Eye Distance:   Bilateral Distance:    Right Eye Near:   Left Eye Near:    Bilateral Near:     Physical Exam  Constitutional: She is oriented to person, place, and time. She appears well-developed and well-nourished. No distress.  HENT:  Right Ear: External ear normal.  Left Ear: External ear normal.  Nose: Nose normal.  Mouth/Throat: Oropharynx is clear and moist.  Eyes: Conjunctivae are  normal. Pupils are equal, round, and reactive to light.  Neck: Normal range of motion. Neck supple.  Cardiovascular: Normal rate, regular rhythm, normal heart sounds and intact distal pulses.   Pulmonary/Chest: Effort normal and breath sounds normal.  Lymphadenopathy:    She has no cervical adenopathy.  Neurological: She is alert and oriented to person, place, and time.  Skin: Skin is warm and dry.  Nursing note and vitals reviewed.    UC Treatments / Results  Labs (all labs ordered are listed, but only abnormal results are displayed) Labs Reviewed - No data to display  EKG  EKG Interpretation None       Radiology No results found.  Procedures Procedures (including critical care time)  Medications Ordered in UC Medications - No data to display   Initial Impression / Assessment and Plan / UC Course  I have reviewed the triage vital signs and the nursing notes.  Pertinent labs & imaging results that were available during my care of the patient were reviewed by me and considered in my medical decision making (see chart for details).  Clinical Course       Final Clinical Impressions(s) / UC Diagnoses   Final diagnoses:  None    New Prescriptions New Prescriptions   No medications on file     Billy Fischer, MD 04/25/16 2009

## 2016-04-25 NOTE — ED Triage Notes (Signed)
Here for cold sx onset x5 days associated w/CP, prod cough, SOB, HA  Denies fevers, chills  A&O x4... NAD

## 2016-08-08 ENCOUNTER — Encounter: Payer: Self-pay | Admitting: Family

## 2016-08-08 ENCOUNTER — Other Ambulatory Visit (INDEPENDENT_AMBULATORY_CARE_PROVIDER_SITE_OTHER): Payer: 59

## 2016-08-08 ENCOUNTER — Other Ambulatory Visit: Payer: Self-pay | Admitting: Family

## 2016-08-08 ENCOUNTER — Ambulatory Visit (INDEPENDENT_AMBULATORY_CARE_PROVIDER_SITE_OTHER): Payer: 59 | Admitting: Family

## 2016-08-08 VITALS — BP 112/70 | HR 69 | Temp 98.2°F | Resp 16 | Ht 63.0 in | Wt 182.1 lb

## 2016-08-08 DIAGNOSIS — F329 Major depressive disorder, single episode, unspecified: Secondary | ICD-10-CM

## 2016-08-08 DIAGNOSIS — Z6832 Body mass index (BMI) 32.0-32.9, adult: Secondary | ICD-10-CM

## 2016-08-08 DIAGNOSIS — Z0000 Encounter for general adult medical examination without abnormal findings: Secondary | ICD-10-CM | POA: Diagnosis not present

## 2016-08-08 DIAGNOSIS — E6609 Other obesity due to excess calories: Secondary | ICD-10-CM | POA: Diagnosis not present

## 2016-08-08 DIAGNOSIS — Z720 Tobacco use: Secondary | ICD-10-CM | POA: Diagnosis not present

## 2016-08-08 DIAGNOSIS — E66812 Obesity, class 2: Secondary | ICD-10-CM | POA: Insufficient documentation

## 2016-08-08 DIAGNOSIS — R4589 Other symptoms and signs involving emotional state: Secondary | ICD-10-CM

## 2016-08-08 DIAGNOSIS — E669 Obesity, unspecified: Secondary | ICD-10-CM | POA: Insufficient documentation

## 2016-08-08 DIAGNOSIS — E559 Vitamin D deficiency, unspecified: Secondary | ICD-10-CM | POA: Insufficient documentation

## 2016-08-08 DIAGNOSIS — Z6836 Body mass index (BMI) 36.0-36.9, adult: Secondary | ICD-10-CM

## 2016-08-08 LAB — COMPREHENSIVE METABOLIC PANEL
ALBUMIN: 3.9 g/dL (ref 3.5–5.2)
ALK PHOS: 34 U/L — AB (ref 39–117)
ALT: 8 U/L (ref 0–35)
AST: 13 U/L (ref 0–37)
BILIRUBIN TOTAL: 0.5 mg/dL (ref 0.2–1.2)
BUN: 10 mg/dL (ref 6–23)
CALCIUM: 9.2 mg/dL (ref 8.4–10.5)
CO2: 26 meq/L (ref 19–32)
CREATININE: 0.79 mg/dL (ref 0.40–1.20)
Chloride: 105 mEq/L (ref 96–112)
GFR: 108.73 mL/min (ref >60.00)
Glucose, Bld: 91 mg/dL (ref 70–99)
Potassium: 4 mEq/L (ref 3.5–5.1)
Sodium: 135 mEq/L (ref 135–145)
TOTAL PROTEIN: 6.5 g/dL (ref 6.0–8.3)

## 2016-08-08 LAB — LIPID PANEL
CHOL/HDL RATIO: 3
CHOLESTEROL: 162 mg/dL (ref 0–200)
HDL: 52.3 mg/dL (ref >39.00)
LDL Cholesterol: 102 mg/dL — ABNORMAL HIGH (ref 0–99)
NonHDL: 109.57
TRIGLYCERIDES: 37 mg/dL (ref 0.0–149.0)
VLDL: 7.4 mg/dL (ref 0.0–40.0)

## 2016-08-08 LAB — VITAMIN D 25 HYDROXY (VIT D DEFICIENCY, FRACTURES): VITD: 13.69 ng/mL — AB (ref 30.00–100.00)

## 2016-08-08 LAB — CBC
HCT: 37.6 % (ref 36.0–46.0)
HEMOGLOBIN: 12.5 g/dL (ref 12.0–15.0)
MCHC: 33.3 g/dL (ref 30.0–36.0)
MCV: 83 fl (ref 78.0–100.0)
PLATELETS: 213 10*3/uL (ref 150.0–400.0)
RBC: 4.52 Mil/uL (ref 3.87–5.11)
RDW: 13.6 % (ref 11.5–15.5)
WBC: 3.6 10*3/uL — AB (ref 4.0–10.5)

## 2016-08-08 LAB — HEMOGLOBIN A1C: HEMOGLOBIN A1C: 5.3 % (ref 4.6–6.5)

## 2016-08-08 LAB — TSH: TSH: 1.06 u[IU]/mL (ref 0.35–4.50)

## 2016-08-08 MED ORDER — BUPROPION HCL ER (SR) 150 MG PO TB12
ORAL_TABLET | ORAL | 1 refills | Status: DC
Start: 2016-08-08 — End: 2016-12-23

## 2016-08-08 MED ORDER — VITAMIN D3 1.25 MG (50000 UT) PO TABS: 50000.0000 [IU] | ORAL_TABLET | ORAL | 0 refills | Status: DC

## 2016-08-08 NOTE — Assessment & Plan Note (Signed)
Continues to experience depressed mood most likely related to nutritional intake and decreased health. Start Wellbutrin. Denies suicidal ideations. Encourage counseling which was declined. Continue to monitor follow-up in one month or sooner. Advised to seek emergency care if thoughts of suicide develop.

## 2016-08-08 NOTE — Progress Notes (Signed)
Subjective:    Patient ID: Mary Harrington, female    DOB: June 04, 1985, 31 y.o.   MRN: [id number]  Chief Complaint  Patient presents with  . CPE    fasting    HPI:  Mary Harrington is a 31 y.o. female who presents today for an annual wellness visit.   1) Health Maintenance -   Diet - Averages about 2-3 meals per day consisting of a regular diet; Describes meals as generally unhealthy; Believes that depression may make her eat all the wrong foods. Caffeine intake 3-4 cups daily.  Exercise - No structured exercise outside of walking at work.    2) Preventative Exams / Immunizations:  Dental -- Up to date  Vision -- Up to date   Health Maintenance  Topic Date Due  . PAP SMEAR  11/14/2017  . TETANUS/TDAP  06/26/2024  . INFLUENZA VACCINE  Completed  . HIV Screening  Completed     There is no immunization history on file for this patient.   No Known Allergies   Outpatient Medications Prior to Visit  Medication Sig Dispense Refill  . amoxicillin (AMOXIL) 500 MG capsule Take 1 capsule (500 mg total) by mouth 3 (three) times daily. 21 capsule 0  . doxycycline (VIBRAMYCIN) 100 MG capsule Take 1 capsule (100 mg total) by mouth 2 (two) times daily. 20 capsule 0  . guaiFENesin-codeine (ROBITUSSIN AC) 100-10 MG/5ML syrup Take 10 mLs by mouth 4 (four) times daily as needed for cough. 180 mL 0  . HYDROcodone-homatropine (HYCODAN) 5-1.5 MG/5ML syrup Take 5 mLs by mouth every 6 (six) hours as needed for cough. 120 mL 0  . ibuprofen (ADVIL,MOTRIN) 600 MG tablet Take 1 tablet (600 mg total) by mouth every 6 (six) hours as needed. 30 tablet 0  . methocarbamol (ROBAXIN) 500 MG tablet Take 1-2 tablets (500-1,000 mg total) by mouth every 6 (six) hours as needed for muscle spasms. 60 tablet 0  . oxyCODONE-acetaminophen (PERCOCET/ROXICET) 5-325 MG tablet Take 1-2 tablets by mouth every 6 (six) hours as needed for severe pain. 12 tablet 0  . predniSONE (DELTASONE) 50 MG tablet Take 1  pill daily for 5 days. 5 tablet 0   No facility-administered medications prior to visit.      Past Medical History:  Diagnosis Date  . Migraines      Past Surgical History:  Procedure Laterality Date  . TUBAL LIGATION       Family History  Problem Relation Age of Onset  . Hypertension Mother   . Lung cancer Father   . Heart failure Father   . Lung cancer Brother   . Cervical cancer Paternal Grandmother      Social History   Social History  . Marital status: Single    Spouse name: N/A  . Number of children: 2  . Years of education: 15   Occupational History  . [place] History Main Topics  . Smoking status: Current Every Day Smoker    Packs/day: 0.25    Years: 9.00    Types: Cigarettes  . Smokeless tobacco: Never Used  . Alcohol use Yes     Comment: Occasional  . Drug use: No  . Sexual activity: Yes    Birth control/ protection: Surgical     Comment: Tuboligation   Other Topics Concern  . Not on file   Social History Narrative   Born and Raised in [place].   Lives with herself, their father and  her children. Lives in a townhouse.   11 girls  and 4 girls    Fun: Works all the time         Review of Systems  Constitutional: Denies fever, chills, or significant weight gain/loss. Positive for fatigue HENT: Head: Denies headache or neck pain Ears: Denies changes in hearing, ringing in ears, earache, drainage Nose: Denies discharge, stuffiness, itching, nosebleed, sinus pain Throat: Denies sore throat, hoarseness, dry mouth, sores, thrush Eyes: Denies loss/changes in vision, pain, redness, blurry/double vision, flashing lights Cardiovascular: Denies chest pain/discomfort, tightness, palpitations, shortness of breath with activity, difficulty lying down, sudden awakening with shortness of breath Positive for leg swelling Respiratory: Denies shortness of breath, cough, sputum production, wheezing Gastrointestinal:  Denies dysphasia, heartburn, change in appetite, nausea, change in bowel habits, rectal bleeding, constipation, diarrhea, yellow skin or eyes Genitourinary: Denies frequency, urgency, burning/pain, blood in urine, incontinence, change in urinary strength. Musculoskeletal: Denies muscle/joint pain, stiffness, back pain, redness or swelling of joints, trauma Skin: Denies rashes, lumps, itching, dryness, color changes, or hair/nail changes Neurological: Denies dizziness, fainting, seizures, weakness, numbness, tingling, tremor Psychiatric - Denies nervousness, stress, depression or memory loss Endocrine: Denies heat or cold intolerance, sweating, frequent urination, excessive thirst, changes in appetite Hematologic: Denies ease of bruising or bleeding     Objective:    BP 112/70 (BP Location: Left Arm, Patient Position: Sitting, Cuff Size: Large)   Pulse 69   Temp 98.2 F (36.8 C) (Oral)   Resp 16   Ht 5\' 3"  (1.6 m)   Wt 182 lb 1.9 oz (82.6 kg)   SpO2 98%   BMI 32.26 kg/m  Nursing note and vital signs reviewed.  Physical Exam  Constitutional: She is oriented to person, place, and time. She appears well-developed and well-nourished.  HENT:  Head: Normocephalic.  Right Ear: Hearing, tympanic membrane, external ear and ear canal normal.  Left Ear: Hearing, tympanic membrane, external ear and ear canal normal.  Nose: Nose normal.  Mouth/Throat: Uvula is midline, oropharynx is clear and moist and mucous membranes are normal.  Eyes: Conjunctivae and EOM are normal. Pupils are equal, round, and reactive to light.  Neck: Neck supple. No JVD present. No tracheal deviation present. No thyromegaly present.  Cardiovascular: Normal rate, regular rhythm, normal heart sounds and intact distal pulses.   Pulmonary/Chest: Effort normal and breath sounds normal.  Abdominal: Soft. Bowel sounds are normal. She exhibits no distension and no mass. There is no tenderness. There is no rebound and no  guarding.  Musculoskeletal: Normal range of motion. She exhibits no edema or tenderness.  Lymphadenopathy:    She has no cervical adenopathy.  Neurological: She is alert and oriented to person, place, and time. She has normal reflexes. No cranial nerve deficit. She exhibits normal muscle tone. Coordination normal.  Skin: Skin is warm and dry.  Psychiatric: She has a normal mood and affect. Her behavior is normal. Judgment and thought content normal.       Assessment & Plan:   Problem List Items Addressed This Visit      Other   Depressed mood    Continues to experience depressed mood most likely related to nutritional intake and decreased health. Start Wellbutrin. Denies suicidal ideations. Encourage counseling which was declined. Continue to monitor follow-up in one month or sooner. Advised to seek emergency care if thoughts of suicide develop.      Relevant Medications   buPROPion (WELLBUTRIN SR) 150 MG 12 hr tablet   Routine adult health  maintenance - Primary    1) Anticipatory Guidance: Discussed importance of wearing a seatbelt while driving and not texting while driving; changing batteries in smoke detector at least once annually; wearing suntan lotion when outside; eating a balanced and moderate diet; getting physical activity at least 30 minutes per day.  2) Immunizations / Screenings / Labs:  All immunizations are up to date per recommendations. Obtain Vitamin D for Vitamin D deficiency screening. Obtain A1c for diabetes screening. All screenings are up to date per recommendations. Obtain CBC, CMET, and lipid profile.    Overall well exam with risk factors for cardiovascular disease including obesity and tobacco use. Recommend weight loss of 5-10% of current body weight through nutrition and physical activity changes. Goal is to increase to 30 minutes of moderate level activity 2-3 times per week. Recommend decrease caloric intake that is moderate, balance, and varied. She is not  ready to quit smoking at this time. Continue other healthy lifestyle behaviors and choices. Follow-up prevention exam in 1 year. Follow-up office visit pending blood work and for chronic conditions.       Relevant Orders   CBC   Comprehensive metabolic panel   Lipid panel   TSH   VITAMIN D 25 Hydroxy (Vit-D Deficiency, Fractures)   Hemoglobin A1c   Class 1 obesity due to excess calories without serious comorbidity with body mass index (BMI) of 32.0 to 32.9 in adult   Tobacco use    Continues with tobacco use. Encouraged complete cessation to reduce risk for chronic disease is the future. She is in the precontemplation stage of change and not ready to quit smoking at this time. She is being started on Wellbutrin for depression which may help with tobacco cessation as well. Continue to monitor.           I have discontinued Ms. Tostenson's ibuprofen, methocarbamol, oxyCODONE-acetaminophen, amoxicillin, predniSONE, HYDROcodone-homatropine, doxycycline, and guaiFENesin-codeine. I am also having her start on buPROPion.   Meds ordered this encounter  Medications  . buPROPion (WELLBUTRIN SR) 150 MG 12 hr tablet    Sig: Take 1 tablet mouth daily for 3 days and then 1 tablet by mouth twice daily.    Dispense:  60 tablet    Refill:  1    Order Specific Question:   Supervising Provider    Answer:   Pricilla Holm A [[id number]]     Follow-up: Return in about 1 month (around 09/08/2016), or if symptoms worsen or fail to improve.   Mary Po, FNP

## 2016-08-08 NOTE — Assessment & Plan Note (Signed)
Continues with tobacco use. Encouraged complete cessation to reduce risk for chronic disease is the future. She is in the precontemplation stage of change and not ready to quit smoking at this time. She is being started on Wellbutrin for depression which may help with tobacco cessation as well. Continue to monitor.

## 2016-08-08 NOTE — Patient Instructions (Addendum)
Thank you for choosing [facility].  SUMMARY AND INSTRUCTIONS:  Please obtain MyFitnessPal.   Goal is to increase physical activity to 30 minutes of moderate level activity 2-3 times per week.  Recommend caloric goal of 1500-1800 calories.  Drink plenty of fluid.   Labs:  Please stop by the lab on the lower level of the building for your blood work. Your results will be released to MyChart (or called to you) after review, usually within 72 hours after test completion. If any changes need to be made, you will be notified at that same time.  1.) The lab is open from 7:30am to 5:30 pm Monday-Friday 2.) No appointment is necessary 3.) Fasting (if needed) is 6-8 hours after food and drink; black coffee and water are okay    Follow up:  If your symptoms worsen or fail to improve, please contact our office for further instruction, or in case of emergency go directly to the emergency room at the closest medical facility.    Health Maintenance, Female Adopting a healthy lifestyle and getting preventive care can go a long way to promote health and wellness. Talk with your health care provider about what schedule of regular examinations is right for you. This is a good chance for you to check in with your provider about disease prevention and staying healthy. In between checkups, there are plenty of things you can do on your own. Experts have done a lot of research about which lifestyle changes and preventive measures are most likely to keep you healthy. Ask your health care provider for more information. Weight and diet Eat a healthy diet  Be sure to include plenty of vegetables, fruits, low-fat dairy products, and lean protein.  Do not eat a lot of foods high in solid fats, added sugars, or salt.  Get regular exercise. This is one of the most important things you can do for your health.  Most adults should exercise for at least 150 minutes each week. The exercise should increase  your heart rate and make you sweat (moderate-intensity exercise).  Most adults should also do strengthening exercises at least twice a week. This is in addition to the moderate-intensity exercise. Maintain a healthy weight  Body mass index (BMI) is a measurement that can be used to identify possible weight problems. It estimates body fat based on height and weight. Your health care provider can help determine your BMI and help you achieve or maintain a healthy weight.  For females 46 years of age and older:  A BMI below 18.5 is considered underweight.  A BMI of 18.5 to 24.9 is normal.  A BMI of 25 to 29.9 is considered overweight.  A BMI of 30 and above is considered obese. Watch levels of cholesterol and blood lipids  You should start having your blood tested for lipids and cholesterol at 32 years of age, then have this test every 5 years.  You may need to have your cholesterol levels checked more often if:  Your lipid or cholesterol levels are high.  You are older than 32 years of age.  You are at high risk for heart disease. Cancer screening Lung Cancer  Lung cancer screening is recommended for adults 81-46 years old who are at high risk for lung cancer because of a history of smoking.  A yearly low-dose CT scan of the lungs is recommended for people who:  Currently smoke.  Have quit within the past 15 years.  Have at least a 30-pack-year  history of smoking. A pack year is smoking an average of one pack of cigarettes a day for 1 year.  Yearly screening should continue until it has been 15 years since you quit.  Yearly screening should stop if you develop a health problem that would prevent you from having lung cancer treatment. Breast Cancer  Practice breast self-awareness. This means understanding how your breasts normally appear and feel.  It also means doing regular breast self-exams. Let your health care provider know about any changes, no matter how small.  If  you are in your 20s or 30s, you should have a clinical breast exam (CBE) by a health care provider every 1-3 years as part of a regular health exam.  If you are 67 or older, have a CBE every year. Also consider having a breast X-ray (mammogram) every year.  If you have a family history of breast cancer, talk to your health care provider about genetic screening.  If you are at high risk for breast cancer, talk to your health care provider about having an MRI and a mammogram every year.  Breast cancer gene (BRCA) assessment is recommended for women who have family members with BRCA-related cancers. BRCA-related cancers include:  Breast.  Ovarian.  Tubal.  Peritoneal cancers.  Results of the assessment will determine the need for genetic counseling and BRCA1 and BRCA2 testing. Cervical Cancer  Your health care provider may recommend that you be screened regularly for cancer of the pelvic organs (ovaries, uterus, and vagina). This screening involves a pelvic examination, including checking for microscopic changes to the surface of your cervix (Pap test). You may be encouraged to have this screening done every 3 years, beginning at age 63.  For women ages 41-65, health care providers may recommend pelvic exams and Pap testing every 3 years, or they may recommend the Pap and pelvic exam, combined with testing for human papilloma virus (HPV), every 5 years. Some types of HPV increase your risk of cervical cancer. Testing for HPV may also be done on women of any age with unclear Pap test results.  Other health care providers may not recommend any screening for nonpregnant women who are considered low risk for pelvic cancer and who do not have symptoms. Ask your health care provider if a screening pelvic exam is right for you.  If you have had past treatment for cervical cancer or a condition that could lead to cancer, you need Pap tests and screening for cancer for at least 20 years after your  treatment. If Pap tests have been discontinued, your risk factors (such as having a new sexual partner) need to be reassessed to determine if screening should resume. Some women have medical problems that increase the chance of getting cervical cancer. In these cases, your health care provider may recommend more frequent screening and Pap tests. Colorectal Cancer  This type of cancer can be detected and often prevented.  Routine colorectal cancer screening usually begins at 32 years of age and continues through 32 years of age.  Your health care provider may recommend screening at an earlier age if you have risk factors for colon cancer.  Your health care provider may also recommend using home test kits to check for hidden blood in the stool.  A small camera at the end of a tube can be used to examine your colon directly (sigmoidoscopy or colonoscopy). This is done to check for the earliest forms of colorectal cancer.  Routine screening usually begins at  age 29.  Direct examination of the colon should be repeated every 5-10 years through 32 years of age. However, you may need to be screened more often if early forms of precancerous polyps or small growths are found. Skin Cancer  Check your skin from head to toe regularly.  Tell your health care provider about any new moles or changes in moles, especially if there is a change in a mole's shape or color.  Also tell your health care provider if you have a mole that is larger than the size of a pencil eraser.  Always use sunscreen. Apply sunscreen liberally and repeatedly throughout the day.  Protect yourself by wearing long sleeves, pants, a wide-brimmed hat, and sunglasses whenever you are outside. Heart disease, diabetes, and high blood pressure  High blood pressure causes heart disease and increases the risk of stroke. High blood pressure is more likely to develop in:  People who have blood pressure in the high end of the normal range  (130-139/85-89 mm Hg).  People who are overweight or obese.  People who are African American.  If you are 34-102 years of age, have your blood pressure checked every 3-5 years. If you are 69 years of age or older, have your blood pressure checked every year. You should have your blood pressure measured twice-once when you are at a hospital or clinic, and once when you are not at a hospital or clinic. Record the average of the two measurements. To check your blood pressure when you are not at a hospital or clinic, you can use:  An automated blood pressure machine at a pharmacy.  A home blood pressure monitor.  If you are between 40 years and 68 years old, ask your health care provider if you should take aspirin to prevent strokes.  Have regular diabetes screenings. This involves taking a blood sample to check your fasting blood sugar level.  If you are at a normal weight and have a low risk for diabetes, have this test once every three years after 32 years of age.  If you are overweight and have a high risk for diabetes, consider being tested at a younger age or more often. Preventing infection Hepatitis B  If you have a higher risk for hepatitis B, you should be screened for this virus. You are considered at high risk for hepatitis B if:  You were born in a country where hepatitis B is common. Ask your health care provider which countries are considered high risk.  Your parents were born in a high-risk country, and you have not been immunized against hepatitis B (hepatitis B vaccine).  You have HIV or AIDS.  You use needles to inject street drugs.  You live with someone who has hepatitis B.  You have had sex with someone who has hepatitis B.  You get hemodialysis treatment.  You take certain medicines for conditions, including cancer, organ transplantation, and autoimmune conditions. Hepatitis C  Blood testing is recommended for:  Everyone born from [birth year] through  1966.  Anyone with known risk factors for hepatitis C. Sexually transmitted infections (STIs)  You should be screened for sexually transmitted infections (STIs) including gonorrhea and chlamydia if:  You are sexually active and are younger than 32 years of age.  You are older than 32 years of age and your health care provider tells you that you are at risk for this type of infection.  Your sexual activity has changed since you were last screened and you  are at an increased risk for chlamydia or gonorrhea. Ask your health care provider if you are at risk.  If you do not have HIV, but are at risk, it may be recommended that you take a prescription medicine daily to prevent HIV infection. This is called pre-exposure prophylaxis (PrEP). You are considered at risk if:  You are sexually active and do not regularly use condoms or know the HIV status of your partner(s).  You take drugs by injection.  You are sexually active with a partner who has HIV. Talk with your health care provider about whether you are at high risk of being infected with HIV. If you choose to begin PrEP, you should first be tested for HIV. You should then be tested every 3 months for as long as you are taking PrEP. Pregnancy  If you are premenopausal and you may become pregnant, ask your health care provider about preconception counseling.  If you may become pregnant, take 400 to 800 micrograms (mcg) of folic acid every day.  If you want to prevent pregnancy, talk to your health care provider about birth control (contraception). Osteoporosis and menopause  Osteoporosis is a disease in which the bones lose minerals and strength with aging. This can result in serious bone fractures. Your risk for osteoporosis can be identified using a bone density scan.  If you are 4 years of age or older, or if you are at risk for osteoporosis and fractures, ask your health care provider if you should be screened.  Ask your health  care provider whether you should take a calcium or vitamin D supplement to lower your risk for osteoporosis.  Menopause may have certain physical symptoms and risks.  Hormone replacement therapy may reduce some of these symptoms and risks. Talk to your health care provider about whether hormone replacement therapy is right for you. Follow these instructions at home:  Schedule regular health, dental, and eye exams.  Stay current with your immunizations.  Do not use any tobacco products including cigarettes, chewing tobacco, or electronic cigarettes.  If you are pregnant, do not drink alcohol.  If you are breastfeeding, limit how much and how often you drink alcohol.  Limit alcohol intake to no more than 1 drink per day for nonpregnant women. One drink equals 12 ounces of beer, 5 ounces of wine, or 1 ounces of hard liquor.  Do not use street drugs.  Do not share needles.  Ask your health care provider for help if you need support or information about quitting drugs.  Tell your health care provider if you often feel depressed.  Tell your health care provider if you have ever been abused or do not feel safe at home. This information is not intended to replace advice given to you by your health care provider. Make sure you discuss any questions you have with your health care provider. Document Released: 11/25/2010 Document Revised: 10/18/2015 Document Reviewed: 02/13/2015 Elsevier Interactive Patient Education  2017 [employer] and Stress Management Stress is a normal reaction to life events. It is what you feel when life demands more than you are used to or more than you can handle. Some stress can be useful. For example, the stress reaction can help you catch the last bus of the day, study for a test, or meet a deadline at work. But stress that occurs too often or for too long can cause problems. It can affect your emotional health and interfere with relationships and  normal daily activities. Too much stress can weaken your immune system and increase your risk for physical illness. If you already have a medical problem, stress can make it worse. What are the causes? All sorts of life events may cause stress. An event that causes stress for one person may not be stressful for another person. Major life events commonly cause stress. These may be positive or negative. Examples include losing your job, moving into a new home, getting married, having a baby, or losing a loved one. Less obvious life events may also cause stress, especially if they occur day after day or in combination. Examples include working long hours, driving in traffic, caring for children, being in debt, or being in a difficult relationship. What are the signs or symptoms? Stress may cause emotional symptoms including, the following:  Anxiety. This is feeling worried, afraid, on edge, overwhelmed, or out of control.  Anger. This is feeling irritated or impatient.  Depression. This is feeling sad, down, helpless, or guilty.  Difficulty focusing, remembering, or making decisions. Stress may cause physical symptoms, including the following:  Aches and pains. These may affect your head, neck, back, stomach, or other areas of your body.  Tight muscles or clenched jaw.  Low energy or trouble sleeping. Stress may cause unhealthy behaviors, including the following:  Eating to feel better (overeating) or skipping meals.  Sleeping too little, too much, or both.  Working too much or putting off tasks (procrastination).  Smoking, drinking alcohol, or using drugs to feel better. How is this diagnosed? Stress is diagnosed through an assessment by your health care provider. Your health care provider will ask questions about your symptoms and any stressful life events.Your health care provider will also ask about your medical history and may order blood tests or other tests. Certain medical  conditions and medicine can cause physical symptoms similar to stress. Mental illness can cause emotional symptoms and unhealthy behaviors similar to stress. Your health care provider may refer you to a mental health professional for further evaluation. How is this treated? Stress management is the recommended treatment for stress.The goals of stress management are reducing stressful life events and coping with stress in healthy ways. Techniques for reducing stressful life events include the following:  Stress identification. Self-monitor for stress and identify what causes stress for you. These skills may help you to avoid some stressful events.  Time management. Set your priorities, keep a calendar of events, and learn to say "no." These tools can help you avoid making too many commitments. Techniques for coping with stress include the following:  Rethinking the problem. Try to think realistically about stressful events rather than ignoring them or overreacting. Try to find the positives in a stressful situation rather than focusing on the negatives.  Exercise. Physical exercise can release both physical and emotional tension. The key is to find a form of exercise you enjoy and do it regularly.  Relaxation techniques. These relax the body and mind. Examples include yoga, meditation, tai chi, biofeedback, deep breathing, progressive muscle relaxation, listening to music, being out in nature, journaling, and other hobbies. Again, the key is to find one or more that you enjoy and can do regularly.  Healthy lifestyle. Eat a balanced diet, get plenty of sleep, and do not smoke. Avoid using alcohol or drugs to relax.  Strong support network. Spend time with family, friends, or other people you enjoy being around.Express your feelings and talk things over with someone you trust. Counseling or  talktherapy with a mental health professional may be helpful if you are having difficulty managing stress on  your own. Medicine is typically not recommended for the treatment of stress.Talk to your health care provider if you think you need medicine for symptoms of stress. Follow these instructions at home:  Keep all follow-up visits as directed by your health care provider.  Take all medicines as directed by your health care provider. Contact a health care provider if:  Your symptoms get worse or you start having new symptoms.  You feel overwhelmed by your problems and can no longer manage them on your own. Get help right away if:  You feel like hurting yourself or someone else. This information is not intended to replace advice given to you by your health care provider. Make sure you discuss any questions you have with your health care provider. Document Released: 11/05/2000 Document Revised: 10/18/2015 Document Reviewed: 01/04/2013 Elsevier Interactive Patient Education  2017 [employer].  Bupropion extended-release tablets (Depression/Mood Disorders) What is this medicine? BUPROPION (byoo PROE pee on) is used to treat depression. This medicine may be used for other purposes; ask your health care provider or pharmacist if you have questions. COMMON BRAND NAME(S): Aplenzin, Budeprion XL, Forfivo XL, Wellbutrin XL What should I tell my health care provider before I take this medicine? They need to know if you have any of these conditions: -an eating disorder, such as anorexia or bulimia -bipolar disorder or psychosis -diabetes or high blood sugar, treated with medication -glaucoma -head injury or brain tumor -heart disease, previous heart attack, or irregular heart beat -high blood pressure -kidney or liver disease -seizures (convulsions) -suicidal thoughts or a previous suicide attempt -Tourette's syndrome -weight loss -an unusual or allergic reaction to bupropion, other medicines, foods, dyes, or preservatives -breast-feeding -pregnant or trying to become pregnant How should I  use this medicine? Take this medicine by mouth with a glass of water. Follow the directions on the prescription label. You can take it with or without food. If it upsets your stomach, take it with food. Do not crush, chew, or cut these tablets. This medicine is taken once daily at the same time each day. Do not take your medicine more often than directed. Do not stop taking this medicine suddenly except upon the advice of your doctor. Stopping this medicine too quickly may cause serious side effects or your condition may worsen. A special MedGuide will be given to you by the pharmacist with each prescription and refill. Be sure to read this information carefully each time. Talk to your pediatrician regarding the use of this medicine in children. Special care may be needed. Overdosage: If you think you have taken too much of this medicine contact a poison control center or emergency room at once. NOTE: This medicine is only for you. Do not share this medicine with others. What if I miss a dose? If you miss a dose, skip the missed dose and take your next tablet at the regular time. Do not take double or extra doses. What may interact with this medicine? Do not take this medicine with any of the following medications: -linezolid -MAOIs like Azilect, Carbex, Eldepryl, Marplan, Nardil, and Parnate -methylene blue (injected into a vein) -other medicines that contain bupropion like Zyban This medicine may also interact with the following medications: -alcohol -certain medicines for anxiety or sleep -certain medicines for blood pressure like metoprolol, propranolol -certain medicines for depression or psychotic disturbances -certain medicines for HIV or AIDS like  efavirenz, lopinavir, nelfinavir, ritonavir -certain medicines for irregular heart beat like propafenone, flecainide -certain medicines for Parkinson's disease like amantadine, levodopa -certain medicines for seizures like carbamazepine,  phenytoin, phenobarbital -cimetidine -clopidogrel -cyclophosphamide -digoxin -furazolidone -isoniazid -nicotine -orphenadrine -procarbazine -steroid medicines like prednisone or cortisone -stimulant medicines for attention disorders, weight loss, or to stay awake -tamoxifen -theophylline -thiotepa -ticlopidine -tramadol -warfarin This list may not describe all possible interactions. Give your health care provider a list of all the medicines, herbs, non-prescription drugs, or dietary supplements you use. Also tell them if you smoke, drink alcohol, or use illegal drugs. Some items may interact with your medicine. What should I watch for while using this medicine? Tell your doctor if your symptoms do not get better or if they get worse. Visit your doctor or health care professional for regular checks on your progress. Because it may take several weeks to see the full effects of this medicine, it is important to continue your treatment as prescribed by your doctor. Patients and their families should watch out for new or worsening thoughts of suicide or depression. Also watch out for sudden changes in feelings such as feeling anxious, agitated, panicky, irritable, hostile, aggressive, impulsive, severely restless, overly excited and hyperactive, or not being able to sleep. If this happens, especially at the beginning of treatment or after a change in dose, call your health care professional. Avoid alcoholic drinks while taking this medicine. Drinking large amounts of alcoholic beverages, using sleeping or anxiety medicines, or quickly stopping the use of these agents while taking this medicine may increase your risk for a seizure. Do not drive or use heavy machinery until you know how this medicine affects you. This medicine can impair your ability to perform these tasks. Do not take this medicine close to bedtime. It may prevent you from sleeping. Your mouth may get dry. Chewing sugarless gum or  sucking hard candy, and drinking plenty of water may help. Contact your doctor if the problem does not go away or is severe. The tablet shell for some brands of this medicine does not dissolve. This is normal. The tablet shell may appear whole in the stool. This is not a cause for concern. What side effects may I notice from receiving this medicine? Side effects that you should report to your doctor or health care professional as soon as possible: -allergic reactions like skin rash, itching or hives, swelling of the face, lips, or tongue -breathing problems -changes in vision -confusion -elevated mood, decreased need for sleep, racing thoughts, impulsive behavior -fast or irregular heartbeat -hallucinations, loss of contact with reality -increased blood pressure -redness, blistering, peeling or loosening of the skin, including inside the mouth -seizures -suicidal thoughts or other mood changes -unusually weak or tired -vomiting Side effects that usually do not require medical attention (report to your doctor or health care professional if they continue or are bothersome): -constipation -headache -loss of appetite -nausea -tremors -weight loss This list may not describe all possible side effects. Call your doctor for medical advice about side effects. You may report side effects to FDA at 1-800-FDA-1088. Where should I keep my medicine? Keep out of the reach of children. Store at room temperature between 15 and 30 degrees C (59 and 86 degrees F). Throw away any unused medicine after the expiration date. NOTE: This sheet is a summary. It may not cover all possible information. If you have questions about this medicine, talk to your doctor, pharmacist, or health care provider.  2018 Elsevier/Gold  Standard (2015-11-02 13:55:13)

## 2016-08-08 NOTE — Assessment & Plan Note (Signed)
1) Anticipatory Guidance: Discussed importance of wearing a seatbelt while driving and not texting while driving; changing batteries in smoke detector at least once annually; wearing suntan lotion when outside; eating a balanced and moderate diet; getting physical activity at least 30 minutes per day.  2) Immunizations / Screenings / Labs:  All immunizations are up to date per recommendations. Obtain Vitamin D for Vitamin D deficiency screening. Obtain A1c for diabetes screening. All screenings are up to date per recommendations. Obtain CBC, CMET, and lipid profile.    Overall well exam with risk factors for cardiovascular disease including obesity and tobacco use. Recommend weight loss of 5-10% of current body weight through nutrition and physical activity changes. Goal is to increase to 30 minutes of moderate level activity 2-3 times per week. Recommend decrease caloric intake that is moderate, balance, and varied. She is not ready to quit smoking at this time. Continue other healthy lifestyle behaviors and choices. Follow-up prevention exam in 1 year. Follow-up office visit pending blood work and for chronic conditions.

## 2016-08-29 ENCOUNTER — Ambulatory Visit: Payer: 59 | Admitting: Family

## 2016-09-10 ENCOUNTER — Ambulatory Visit: Payer: 59 | Admitting: Family

## 2016-10-08 DIAGNOSIS — Z13228 Encounter for screening for other metabolic disorders: Secondary | ICD-10-CM | POA: Diagnosis not present

## 2016-10-08 DIAGNOSIS — Z131 Encounter for screening for diabetes mellitus: Secondary | ICD-10-CM | POA: Diagnosis not present

## 2016-10-08 DIAGNOSIS — Z01419 Encounter for gynecological examination (general) (routine) without abnormal findings: Secondary | ICD-10-CM | POA: Diagnosis not present

## 2016-10-08 DIAGNOSIS — Z6833 Body mass index (BMI) 33.0-33.9, adult: Secondary | ICD-10-CM | POA: Diagnosis not present

## 2016-10-08 DIAGNOSIS — Z130 Encounter for screening for diseases of the blood and blood-forming organs and certain disorders involving the immune mechanism: Secondary | ICD-10-CM | POA: Diagnosis not present

## 2016-11-12 ENCOUNTER — Ambulatory Visit: Payer: 59 | Admitting: Family Medicine

## 2016-11-13 ENCOUNTER — Telehealth: Payer: Self-pay | Admitting: Internal Medicine

## 2016-11-13 ENCOUNTER — Ambulatory Visit (INDEPENDENT_AMBULATORY_CARE_PROVIDER_SITE_OTHER): Payer: 59 | Admitting: Internal Medicine

## 2016-11-13 ENCOUNTER — Encounter: Payer: Self-pay | Admitting: Internal Medicine

## 2016-11-13 VITALS — BP 126/84 | HR 65 | Ht 63.0 in | Wt 195.0 lb

## 2016-11-13 DIAGNOSIS — R519 Headache, unspecified: Secondary | ICD-10-CM | POA: Insufficient documentation

## 2016-11-13 DIAGNOSIS — H9312 Tinnitus, left ear: Secondary | ICD-10-CM | POA: Insufficient documentation

## 2016-11-13 DIAGNOSIS — R51 Headache: Secondary | ICD-10-CM

## 2016-11-13 DIAGNOSIS — J309 Allergic rhinitis, unspecified: Secondary | ICD-10-CM | POA: Insufficient documentation

## 2016-11-13 DIAGNOSIS — H6692 Otitis media, unspecified, left ear: Secondary | ICD-10-CM

## 2016-11-13 MED ORDER — AZITHROMYCIN 250 MG PO TABS
ORAL_TABLET | ORAL | 1 refills | Status: DC
Start: 2016-11-13 — End: 2016-12-23

## 2016-11-13 MED ORDER — IBUPROFEN 600 MG PO TABS: 600.0000 mg | ORAL_TABLET | Freq: Four times a day (QID) | ORAL | 0 refills | Status: DC | PRN

## 2016-11-13 MED ORDER — TRIAMCINOLONE ACETONIDE 55 MCG/ACT NA AERO: 2.0000 | INHALATION_SPRAY | Freq: Every day | NASAL | 12 refills | Status: DC

## 2016-11-13 MED ORDER — CETIRIZINE HCL 10 MG PO TABS: 10.0000 mg | ORAL_TABLET | Freq: Every day | ORAL | 11 refills | Status: DC

## 2016-11-13 MED ORDER — AZITHROMYCIN 250 MG PO TABS: ORAL_TABLET | ORAL | 1 refills | Status: DC

## 2016-11-13 NOTE — Patient Instructions (Signed)
.  Please take all new medication as prescribed - the antibiotic, zyrtec and nasacort for allergies, and ibuprofen for pain  Please continue all other medications as before, and refills have been done if requested.  Please have the pharmacy call with any other refills you may need.  Please keep your appointments with your specialists as you may have planned

## 2016-11-13 NOTE — Telephone Encounter (Signed)
Please send in medications sent in to day to [facility] on [address]

## 2016-11-13 NOTE — Progress Notes (Signed)
   Subjective:    Patient ID: Mary Harrington, female    DOB: [birth year], 31 y.o.   MRN: [id number]  HPI  Here with 2-3 days acute onset fever, facial pain, pressure, headache, general weakness and malaise, and greenish d/c, with mild ST, tinnitus left ear, and cough, and gunky eye d/c this am,  but pt denies chest pain, wheezing, increased sob or doe, orthopnea, PND, increased LE swelling, palpitations, dizziness or syncope.  Does have several wks ongoing nasal allergy symptoms with clearish congestion, itch and sneezing, without fever, pain, ST, cough, swelling or wheezing.  / Pt denies polydipsia, polyuria   S/p BTL Past Medical History:  Diagnosis Date  . Migraines    Past Surgical History:  Procedure Laterality Date  . TUBAL LIGATION      reports that she has been smoking Cigarettes.  She has a 2.25 pack-year smoking history. She has never used smokeless tobacco. She reports that she drinks alcohol. She reports that she does not use drugs. family history includes Cervical cancer in her paternal grandmother; Heart failure in her father; Hypertension in her mother; Lung cancer in her brother and father. No Known Allergies Current Outpatient Prescriptions on File Prior to Visit  Medication Sig Dispense Refill  . buPROPion (WELLBUTRIN SR) 150 MG 12 hr tablet Take 1 tablet mouth daily for 3 days and then 1 tablet by mouth twice daily. 60 tablet 1  . Cholecalciferol (VITAMIN D3) 50000 units TABS Take 50,000 Units by mouth once a week. 9 tablet 0   No current facility-administered medications on file prior to visit.    Review of Systems  Constitutional: Negative for other unusual diaphoresis or sweats HENT: Negative for ear discharge or swelling Eyes: Negative for other worsening visual disturbances Respiratory: Negative for stridor or other swelling  Gastrointestinal: Negative for worsening distension or other blood Genitourinary: Negative for retention or other urinary  change Musculoskeletal: Negative for other MSK pain or swelling Skin: Negative for color change or other new lesions Neurological: Negative for worsening tremors and other numbness  Psychiatric/Behavioral: Negative for worsening agitation or other fatigue All other system neg per pt    Objective:   Physical Exam BP 126/84   Pulse 65   Ht 5\' 3"  (1.6 m)   Wt 195 lb (88.5 kg)   SpO2 100%   BMI 34.54 kg/m  VS noted,  Constitutional: Pt appears in NAD HENT: Head: NCAT.  Right Ear: External ear normal.  Left Ear: External ear normal.  Eyes: . Pupils are equal, round, and reactive to light. Conjunctivae and EOM are normal Nose: without d/c or deformity Neck: Neck supple. Gross normal ROM Cardiovascular: Normal rate and regular rhythm.   Pulmonary/Chest: Effort normal and breath sounds without rales or wheezing.  Bilat tm's with mild erythema, left >> right.  Max sinus areas mild tender.  Pharynx with mild erythema, no exudate Neurological: Pt is alert. At baseline orientation, motor grossly intact, cn 2-12 intact Skin: Skin is warm. No rashes, other new lesions, no LE edema Psychiatric: Pt behavior is normal without agitation  No other exam findings    Assessment & Plan:

## 2016-11-13 NOTE — Telephone Encounter (Signed)
Done

## 2016-11-15 NOTE — Assessment & Plan Note (Signed)
No specific other tx, for supportive care

## 2016-11-15 NOTE — Assessment & Plan Note (Signed)
Mild to mod, for zyrtec and nasacort asd,  to f/u any worsening symptoms or concerns 

## 2016-11-15 NOTE — Assessment & Plan Note (Signed)
Mild, likely due to above, for ibuprofen prn

## 2016-11-15 NOTE — Assessment & Plan Note (Signed)
Mild to mod, for antibx course,  to f/u any worsening symptoms or concerns 

## 2016-11-21 DIAGNOSIS — D261 Other benign neoplasm of corpus uteri: Secondary | ICD-10-CM | POA: Diagnosis not present

## 2016-11-21 DIAGNOSIS — N946 Dysmenorrhea, unspecified: Secondary | ICD-10-CM | POA: Diagnosis not present

## 2016-11-21 DIAGNOSIS — N921 Excessive and frequent menstruation with irregular cycle: Secondary | ICD-10-CM | POA: Diagnosis not present

## 2016-11-21 DIAGNOSIS — N924 Excessive bleeding in the premenopausal period: Secondary | ICD-10-CM | POA: Diagnosis not present

## 2016-12-11 ENCOUNTER — Other Ambulatory Visit: Payer: Self-pay | Admitting: Obstetrics and Gynecology

## 2016-12-11 DIAGNOSIS — N6310 Unspecified lump in the right breast, unspecified quadrant: Secondary | ICD-10-CM

## 2016-12-15 NOTE — Progress Notes (Signed)
LM on VM for Mary Harrington, scheduler to inform Dr. Gaetano Net to place orders in [facility]!

## 2016-12-23 ENCOUNTER — Encounter: Payer: Self-pay | Admitting: Family

## 2016-12-23 ENCOUNTER — Ambulatory Visit (INDEPENDENT_AMBULATORY_CARE_PROVIDER_SITE_OTHER): Payer: 59 | Admitting: Family

## 2016-12-23 VITALS — BP 122/72 | HR 86 | Temp 98.5°F | Resp 16 | Ht 63.0 in | Wt 205.8 lb

## 2016-12-23 DIAGNOSIS — H6982 Other specified disorders of Eustachian tube, left ear: Secondary | ICD-10-CM | POA: Diagnosis not present

## 2016-12-23 MED ORDER — PREDNISONE 10 MG (21) PO TBPK: ORAL_TABLET | ORAL | 0 refills | Status: DC

## 2016-12-23 NOTE — Assessment & Plan Note (Signed)
Symptoms and exam consistent with eustachian tube dysfunction of the left ear given increased fluid noted upon exam. Start prednisone taper. Continue previously prescribed triamcinolone nasal spray and recommend over-the-counter Aleve D. No indication for infection at this time. Follow-up if symptoms worsen or do not improve.

## 2016-12-23 NOTE — Patient Instructions (Addendum)
Thank you for choosing [facility].  SUMMARY AND INSTRUCTIONS:  Please start the prednisone taper.   Recommend Aleve-D to help with congestion.  Continue the nasacort.  Follow up if symptoms worsen.   Medication:  Your prescription(s) have been submitted to your pharmacy or been printed and provided for you. Please take as directed and contact our office if you believe you are having problem(s) with the medication(s) or have any questions.   Follow up:  If your symptoms worsen or fail to improve, please contact our office for further instruction, or in case of emergency go directly to the emergency room at the closest medical facility.

## 2016-12-23 NOTE — Progress Notes (Signed)
Subjective:    Patient ID: Mary Harrington, female    DOB: 07/27/1985, 31 y.o.   MRN: [id number]  Chief Complaint  Patient presents with  . Ear Pain    left ear has     HPI:  Mary Harrington is a 31 y.o. female who  has a past medical history of Migraines. and presents today for an office visit.  Associated symptom of ear fullness has been going on for about 1 month continuing since her previous office visit. Headache severity described as irritating with pain located on the left hand side and described as pounding. Modifying factors include ibuprofen and completion of the antibiotic which has not helped. No changes in hearing or discharge from the left ear. Sensitivity to sound on the left with sensitivity to light. No fevers. Course of the symptoms initially worsened and now have come back.   No Known Allergies    Outpatient Medications Prior to Visit  Medication Sig Dispense Refill  . cetirizine (ZYRTEC) 10 MG tablet Take 1 tablet (10 mg total) by mouth daily. 30 tablet 11  . Cholecalciferol (VITAMIN D3) 50000 units TABS Take 50,000 Units by mouth once a week. 9 tablet 0  . ibuprofen (ADVIL,MOTRIN) 600 MG tablet Take 1 tablet (600 mg total) by mouth every 6 (six) hours as needed. 40 tablet 0  . triamcinolone (NASACORT) 55 MCG/ACT AERO nasal inhaler Place 2 sprays into the nose daily. 1 Inhaler 12  . azithromycin (ZITHROMAX Z-PAK) 250 MG tablet 2 tab by mouth day 1, then 1 per day 6 tablet 1  . buPROPion (WELLBUTRIN SR) 150 MG 12 hr tablet Take 1 tablet mouth daily for 3 days and then 1 tablet by mouth twice daily. 60 tablet 1   No facility-administered medications prior to visit.     Review of Systems  Constitutional: Negative for chills and fever.  HENT: Positive for congestion and ear pain. Negative for sinus pain, sinus pressure and sore throat.   Respiratory: Negative for chest tightness, shortness of breath and wheezing.   Cardiovascular: Negative for chest pain,  palpitations and leg swelling.  Neurological: Positive for headaches.      Objective:    BP 122/72 (BP Location: Right Arm, Patient Position: Sitting, Cuff Size: Large)   Pulse 86   Temp 98.5 F (36.9 C) (Oral)   Resp 16   Ht 5\' 3"  (1.6 m)   Wt 205 lb 12.8 oz (93.4 kg)   SpO2 97%   BMI 36.46 kg/m  Nursing note and vital signs reviewed.  Physical Exam  Constitutional: She is oriented to person, place, and time. She appears well-developed and well-nourished. No distress.  HENT:  Right Ear: Hearing, tympanic membrane, external ear and ear canal normal.  Left Ear: Hearing, tympanic membrane, external ear and ear canal normal.  Nose: Nose normal. Right sinus exhibits no maxillary sinus tenderness and no frontal sinus tenderness. Left sinus exhibits no maxillary sinus tenderness and no frontal sinus tenderness.  Mouth/Throat: Uvula is midline, oropharynx is clear and moist and mucous membranes are normal.  Mild increased fluid behind left ear. No redness.   Cardiovascular: Normal rate, regular rhythm, normal heart sounds and intact distal pulses.   Pulmonary/Chest: Effort normal and breath sounds normal.  Neurological: She is alert and oriented to person, place, and time.  Skin: Skin is warm and dry.  Psychiatric: She has a normal mood and affect. Her behavior is normal. Judgment and thought content normal.  Assessment & Plan:   Problem List Items Addressed This Visit      Nervous and Auditory   Eustachian tube dysfunction, left - Primary    Symptoms and exam consistent with eustachian tube dysfunction of the left ear given increased fluid noted upon exam. Start prednisone taper. Continue previously prescribed triamcinolone nasal spray and recommend over-the-counter Aleve D. No indication for infection at this time. Follow-up if symptoms worsen or do not improve.          I have discontinued Ms. Revard's buPROPion and azithromycin. I am also having her start on  predniSONE. Additionally, [name] maintain her Vitamin D3, cetirizine, ibuprofen, and triamcinolone.   Meds ordered this encounter  Medications  . predniSONE (STERAPRED UNI-PAK 21 TAB) 10 MG (21) TBPK tablet    Sig: Take 6 tablets x 1 day, 5 tablets x 1 day, 4 tablets x 1 day, 3 tablets x 1 day, 2 tablets x 1 day, 1 tablet x 1 day    Dispense:  21 tablet    Refill:  0    Order Specific Question:   Supervising Provider    Answer:   Pricilla Holm A [[id number]]     Follow-up: Return if symptoms worsen or fail to improve.  Mauricio Po, FNP

## 2017-01-05 NOTE — Patient Instructions (Addendum)
Mary Harrington  01/05/2017      Your procedure is scheduled on 01-12-17  Report to [facility]  at  6A.M.  Call this number if you have problems the morning of surgery:[phone]  OUR ADDRESS IS [place], WE ARE LOCATED IN THE [facility]ICAL [facility] WITH [facility].   Remember:  Do not eat food or drink liquids after midnight.  Take these medicines the morning of surgery with A SIP OF WATER: cetirizine(zyrtec)    Do not wear jewelry, make-up or nail polish.  Do not wear lotions, powders, or perfumes, or deoderant.  Do not shave 48 hours prior to surgery.  Men may shave face and neck.  Do not bring valuables to the hospital.  [facility] is not responsible for any belongings or valuables.  Contacts, dentures or bridgework may not be worn into surgery.    For patients admitted to the hospital, discharge time will be determined by your treatment team.  Patients discharged the day of surgery will not be allowed to drive home.   Special instructions:  N/A  Please read over the following fact sheets that you were given:       [facility] - Preparing for Surgery Before surgery, you can play an important role.  Because skin is not sterile, your skin needs to be as free of germs as possible.  You can reduce the number of germs on your skin by washing with CHG (chlorahexidine gluconate) soap before surgery.  CHG is an antiseptic cleaner which kills germs and bonds with the skin to continue killing germs even after washing. Please DO NOT use if you have an allergy to CHG or antibacterial soaps.  If your skin becomes reddened/irritated stop using the CHG and inform your nurse when you arrive at Short Stay. Do not shave (including legs and underarms) for at least 48 hours prior to the first CHG shower.  You may shave your face/neck. Please follow these instructions carefully:  1.  Shower with CHG Soap the night before surgery and the  morning of Surgery.  2.  If you  choose to wash your hair, wash your hair first as usual with your  normal  shampoo.  3.  After you shampoo, rinse your hair and body thoroughly to remove the  shampoo.                           4.  Use CHG as you would any other liquid soap.  You can apply chg directly  to the skin and wash                       Gently with a scrungie or clean washcloth.  5.  Apply the CHG Soap to your body ONLY FROM THE NECK DOWN.   Do not use on face/ open                           Wound or open sores. Avoid contact with eyes, ears mouth and genitals (private parts).                       Wash face,  Genitals (private parts) with your normal soap.             6.  Wash thoroughly, paying special attention to the area where your surgery  will be  performed.  7.  Thoroughly rinse your body with warm water from the neck down.  8.  DO NOT shower/wash with your normal soap after using and rinsing off  the CHG Soap.                9.  Pat yourself dry with a clean towel.            10.  Wear clean pajamas.            11.  Place clean sheets on your bed the night of your first shower and do not  sleep with pets. Day of Surgery : Do not apply any lotions/deodorants the morning of surgery.  Please wear clean clothes to the hospital/surgery center.  FAILURE TO FOLLOW THESE INSTRUCTIONS MAY RESULT IN THE CANCELLATION OF YOUR SURGERY PATIENT SIGNATURE_________________________________  NURSE SIGNATURE__________________________________  ________________________________________________________________________

## 2017-01-06 DIAGNOSIS — N920 Excessive and frequent menstruation with regular cycle: Secondary | ICD-10-CM | POA: Diagnosis not present

## 2017-01-06 DIAGNOSIS — N944 Primary dysmenorrhea: Secondary | ICD-10-CM | POA: Diagnosis not present

## 2017-01-07 ENCOUNTER — Encounter (INDEPENDENT_AMBULATORY_CARE_PROVIDER_SITE_OTHER): Payer: Self-pay

## 2017-01-07 ENCOUNTER — Encounter (HOSPITAL_COMMUNITY): Payer: Self-pay

## 2017-01-07 ENCOUNTER — Encounter (HOSPITAL_COMMUNITY)
Admission: RE | Admit: 2017-01-07 | Discharge: 2017-01-07 | Disposition: A | Discharge status: Home / Self Care | Payer: 59 | Source: Ambulatory Visit | Attending: Obstetrics and Gynecology | Admitting: Obstetrics and Gynecology

## 2017-01-07 DIAGNOSIS — Z01818 Encounter for other preprocedural examination: Secondary | ICD-10-CM | POA: Diagnosis not present

## 2017-01-07 DIAGNOSIS — N921 Excessive and frequent menstruation with irregular cycle: Secondary | ICD-10-CM | POA: Insufficient documentation

## 2017-01-07 HISTORY — DX: Angina pectoris, unspecified: I20.9

## 2017-01-07 HISTORY — DX: Bradycardia, unspecified: R00.1

## 2017-01-07 HISTORY — DX: Excessive and frequent menstruation with regular cycle: N92.0

## 2017-01-07 LAB — CBC
HCT: 39.6 % (ref 36.0–46.0)
HEMOGLOBIN: 13.1 g/dL (ref 12.0–15.0)
MCH: 27.1 pg (ref 26.0–34.0)
MCHC: 33.1 g/dL (ref 30.0–36.0)
MCV: 81.8 fL (ref 78.0–100.0)
Platelets: 249 10*3/uL (ref 150–400)
RBC: 4.84 MIL/uL (ref 3.87–5.11)
RDW: 13.4 % (ref 11.5–15.5)
WBC: 4 10*3/uL (ref 4.0–10.5)

## 2017-01-07 LAB — HCG, SERUM, QUALITATIVE: Preg, Serum: NEGATIVE

## 2017-01-07 LAB — ABO/RH: ABO/RH(D): O POS

## 2017-01-09 NOTE — Progress Notes (Signed)
Mary Harrington is an 31 y.o. female G2P2, S/P BTL with heavy, frequent, painful menses. U/S shows uterus 8.0 x 4.2 x 5.3 cm, 2.9 and 2.3 cm probable hemorrhagic cysts on right ovary. EMB is benign.  Pertinent Gynecological History: Menses: flow is excessive with use of many pads or tampons on heaviest days Bleeding: dysfunctional uterine bleeding Contraception: tubal ligation DES exposure: denies Blood transfusions: none Sexually transmitted diseases: no past history Previous GYN Procedures: none  Last mammogram: abnormal: benign breast biopsy Date: 2015 Last pap: normal Date: 2015 OB History: G2, P2   Menstrual History: Menarche age: unknown Patient's last menstrual period was 01/01/2017 (exact date).    Past Medical History:  Diagnosis Date  . Anginal pain ([place])    within the last 3 months; occurs center of the chest   . Bradycardia    hx of syncope in association ; per patient "i havent fell out in like a year but sometimes i feel like i am about to"   . Menorrhagia   . Migraines     Past Surgical History:  Procedure Laterality Date  . TUBAL LIGATION      Family History  Problem Relation Age of Onset  . Hypertension Mother   . Lung cancer Father   . Heart failure Father   . Lung cancer Brother   . Cervical cancer Paternal Grandmother     Social History:  reports that she has been smoking Cigarettes.  She has a 2.25 pack-year smoking history. She has never used smokeless tobacco. She reports that she drinks alcohol. She reports that she does not use drugs.  Allergies: No Known Allergies  No prescriptions prior to admission.    ROS  Last menstrual period 01/01/2017. Physical Exam  Cardiovascular: Normal rate and regular rhythm.   Respiratory: Effort normal and breath sounds normal.  GI: Soft. There is no tenderness.  Genitourinary:  Genitourinary Comments: Uterus RV    No results found for this or any previous visit (from the past 24 hour(s)).  No  results found.  Assessment/Plan: 31 yo G2P2 BTL with menometrorrhagia and dysmenorrhea  D/W patient LAVH/BS and removal of ovary if abnormal D/W risks including infection, organ damage, bleeding/transfusion-HIV/Hep, DVT/PE, pneumonia, fistula, post op pain and painful intercourse, laparotomy, return to OR. Patient states she understands and agrees  [facility] [place] [facility] II,Mary Harrington E 01/09/2017, 1:47 PM

## 2017-01-12 ENCOUNTER — Observation Stay (HOSPITAL_BASED_OUTPATIENT_CLINIC_OR_DEPARTMENT_OTHER)
Admission: RE | Admit: 2017-01-12 | Discharge: 2017-01-13 | Disposition: A | Discharge status: Home / Self Care | Payer: 59 | Source: Ambulatory Visit | Attending: Obstetrics and Gynecology | Admitting: Obstetrics and Gynecology

## 2017-01-12 ENCOUNTER — Ambulatory Visit (HOSPITAL_BASED_OUTPATIENT_CLINIC_OR_DEPARTMENT_OTHER): Payer: 59 | Admitting: Anesthesiology

## 2017-01-12 ENCOUNTER — Encounter (HOSPITAL_BASED_OUTPATIENT_CLINIC_OR_DEPARTMENT_OTHER)
Admission: RE | Disposition: A | Discharge status: Home / Self Care | Payer: Self-pay | Source: Ambulatory Visit | Attending: Obstetrics and Gynecology

## 2017-01-12 ENCOUNTER — Encounter (HOSPITAL_BASED_OUTPATIENT_CLINIC_OR_DEPARTMENT_OTHER): Payer: Self-pay | Admitting: *Deleted

## 2017-01-12 DIAGNOSIS — Z79899 Other long term (current) drug therapy: Secondary | ICD-10-CM | POA: Diagnosis not present

## 2017-01-12 DIAGNOSIS — N946 Dysmenorrhea, unspecified: Secondary | ICD-10-CM | POA: Insufficient documentation

## 2017-01-12 DIAGNOSIS — N921 Excessive and frequent menstruation with irregular cycle: Secondary | ICD-10-CM

## 2017-01-12 DIAGNOSIS — N800 Endometriosis of uterus: Secondary | ICD-10-CM | POA: Diagnosis not present

## 2017-01-12 DIAGNOSIS — N944 Primary dysmenorrhea: Secondary | ICD-10-CM | POA: Diagnosis not present

## 2017-01-12 DIAGNOSIS — N920 Excessive and frequent menstruation with regular cycle: Principal | ICD-10-CM | POA: Diagnosis present

## 2017-01-12 DIAGNOSIS — F17210 Nicotine dependence, cigarettes, uncomplicated: Secondary | ICD-10-CM | POA: Insufficient documentation

## 2017-01-12 DIAGNOSIS — J309 Allergic rhinitis, unspecified: Secondary | ICD-10-CM | POA: Diagnosis not present

## 2017-01-12 DIAGNOSIS — N72 Inflammatory disease of cervix uteri: Secondary | ICD-10-CM | POA: Diagnosis not present

## 2017-01-12 DIAGNOSIS — N809 Endometriosis, unspecified: Secondary | ICD-10-CM | POA: Diagnosis not present

## 2017-01-12 HISTORY — PX: LAPAROSCOPIC VAGINAL HYSTERECTOMY WITH SALPINGECTOMY: SHX6680

## 2017-01-12 LAB — POCT I-STAT, CHEM 8
BUN: 5 mg/dL — AB (ref 6–20)
CALCIUM ION: 1.24 mmol/L (ref 1.15–1.40)
CHLORIDE: 105 mmol/L (ref 101–111)
Creatinine, Ser: 0.7 mg/dL (ref 0.44–1.00)
GLUCOSE: 100 mg/dL — AB (ref 65–99)
HCT: 38 % (ref 36.0–46.0)
Hemoglobin: 12.9 g/dL (ref 12.0–15.0)
POTASSIUM: 3.7 mmol/L (ref 3.5–5.1)
Sodium: 140 mmol/L (ref 135–145)
TCO2: 25 mmol/L (ref 0–100)

## 2017-01-12 LAB — TYPE AND SCREEN
ABO/RH(D): O POS
ANTIBODY SCREEN: NEGATIVE

## 2017-01-12 SURGERY — HYSTERECTOMY, VAGINAL, LAPAROSCOPY-ASSISTED, WITH SALPINGECTOMY
Anesthesia: General | Laterality: Bilateral

## 2017-01-12 MED ORDER — HYDROMORPHONE HCL-NACL 0.5-0.9 MG/ML-% IV SOSY
PREFILLED_SYRINGE | INTRAVENOUS | Status: AC
  Filled 2017-01-12: qty 1

## 2017-01-12 MED ORDER — MAGNESIUM HYDROXIDE 400 MG/5ML PO SUSP
30.0000 mL | Freq: Every day | ORAL | Status: DC | PRN
  Filled 2017-01-12: qty 30

## 2017-01-12 MED ORDER — LACTATED RINGERS IV SOLN
INTRAVENOUS | Status: DC
  Administered 2017-01-12 (×2): via INTRAVENOUS
  Filled 2017-01-12: qty 1000

## 2017-01-12 MED ORDER — LIDOCAINE 2% (20 MG/ML) 5 ML SYRINGE
INTRAMUSCULAR | Status: AC
  Filled 2017-01-12: qty 5

## 2017-01-12 MED ORDER — HYDROMORPHONE HCL 1 MG/ML IJ SOLN
0.2500 mg | INTRAMUSCULAR | Status: DC | PRN
  Administered 2017-01-12 (×2): 0.25 mg via INTRAVENOUS
  Filled 2017-01-12: qty 0.5

## 2017-01-12 MED ORDER — PROPOFOL 10 MG/ML IV BOLUS
INTRAVENOUS | Status: AC
  Filled 2017-01-12: qty 40

## 2017-01-12 MED ORDER — ONDANSETRON HCL 4 MG/2ML IJ SOLN
4.0000 mg | Freq: Four times a day (QID) | INTRAMUSCULAR | Status: DC | PRN
  Administered 2017-01-12: 4 mg via INTRAVENOUS
  Filled 2017-01-12: qty 2

## 2017-01-12 MED ORDER — OXYCODONE-ACETAMINOPHEN 5-325 MG PO TABS
ORAL_TABLET | ORAL | Status: AC
  Filled 2017-01-12: qty 2

## 2017-01-12 MED ORDER — FENTANYL CITRATE (PF) 100 MCG/2ML IJ SOLN
INTRAMUSCULAR | Status: AC
  Filled 2017-01-12: qty 4

## 2017-01-12 MED ORDER — ROCURONIUM BROMIDE 100 MG/10ML IV SOLN
INTRAVENOUS | Status: DC | PRN
  Administered 2017-01-12: 50 mg via INTRAVENOUS

## 2017-01-12 MED ORDER — ONDANSETRON HCL 4 MG/2ML IJ SOLN
INTRAMUSCULAR | Status: AC
  Filled 2017-01-12: qty 2

## 2017-01-12 MED ORDER — FENTANYL CITRATE (PF) 100 MCG/2ML IJ SOLN
INTRAMUSCULAR | Status: DC | PRN
  Administered 2017-01-12 (×8): 25 ug via INTRAVENOUS

## 2017-01-12 MED ORDER — SENNA 8.6 MG PO TABS
1.0000 | ORAL_TABLET | Freq: Two times a day (BID) | ORAL | Status: DC
  Administered 2017-01-12: 8.6 mg via ORAL
  Filled 2017-01-12: qty 1

## 2017-01-12 MED ORDER — SIMETHICONE 80 MG PO CHEW
80.0000 mg | CHEWABLE_TABLET | Freq: Four times a day (QID) | ORAL | Status: DC | PRN
  Administered 2017-01-13: 80 mg via ORAL
  Filled 2017-01-12 (×2): qty 1

## 2017-01-12 MED ORDER — DEXAMETHASONE SODIUM PHOSPHATE 10 MG/ML IJ SOLN
INTRAMUSCULAR | Status: AC
  Filled 2017-01-12: qty 1

## 2017-01-12 MED ORDER — IBUPROFEN 600 MG PO TABS
600.0000 mg | ORAL_TABLET | Freq: Four times a day (QID) | ORAL | Status: DC | PRN
  Administered 2017-01-12 – 2017-01-13 (×3): 600 mg via ORAL
  Filled 2017-01-12: qty 1

## 2017-01-12 MED ORDER — ONDANSETRON HCL 4 MG PO TABS
4.0000 mg | ORAL_TABLET | Freq: Four times a day (QID) | ORAL | Status: DC | PRN
  Filled 2017-01-12: qty 1

## 2017-01-12 MED ORDER — SUGAMMADEX SODIUM 200 MG/2ML IV SOLN
INTRAVENOUS | Status: DC | PRN
  Administered 2017-01-12: 200 mg via INTRAVENOUS

## 2017-01-12 MED ORDER — SUCCINYLCHOLINE CHLORIDE 200 MG/10ML IV SOSY
PREFILLED_SYRINGE | INTRAVENOUS | Status: AC
  Filled 2017-01-12: qty 10

## 2017-01-12 MED ORDER — MIDAZOLAM HCL 2 MG/2ML IJ SOLN
INTRAMUSCULAR | Status: AC
  Filled 2017-01-12: qty 4

## 2017-01-12 MED ORDER — CEFAZOLIN SODIUM-DEXTROSE 2-4 GM/100ML-% IV SOLN
2.0000 g | INTRAVENOUS | Status: AC
  Administered 2017-01-12: 2 g via INTRAVENOUS
  Filled 2017-01-12: qty 100

## 2017-01-12 MED ORDER — BUPIVACAINE HCL (PF) 0.5 % IJ SOLN
INTRAMUSCULAR | Status: DC | PRN
  Administered 2017-01-12: 24 mL
  Administered 2017-01-12: 6 mL

## 2017-01-12 MED ORDER — OXYCODONE-ACETAMINOPHEN 5-325 MG PO TABS
1.0000 | ORAL_TABLET | ORAL | Status: DC | PRN
  Administered 2017-01-12: 2 via ORAL
  Administered 2017-01-12 – 2017-01-13 (×2): 1 via ORAL
  Filled 2017-01-12: qty 2

## 2017-01-12 MED ORDER — ONDANSETRON HCL 4 MG/2ML IJ SOLN
INTRAMUSCULAR | Status: DC | PRN
  Administered 2017-01-12: 4 mg via INTRAVENOUS

## 2017-01-12 MED ORDER — LIDOCAINE HCL (CARDIAC) 20 MG/ML IV SOLN
INTRAVENOUS | Status: DC | PRN
  Administered 2017-01-12: 100 mg via INTRAVENOUS

## 2017-01-12 MED ORDER — MENTHOL 3 MG MT LOZG
1.0000 | LOZENGE | OROMUCOSAL | Status: DC | PRN
  Filled 2017-01-12: qty 9

## 2017-01-12 MED ORDER — ZOLPIDEM TARTRATE 5 MG PO TABS
5.0000 mg | ORAL_TABLET | Freq: Every evening | ORAL | Status: DC | PRN
  Filled 2017-01-12: qty 1

## 2017-01-12 MED ORDER — DEXAMETHASONE SODIUM PHOSPHATE 4 MG/ML IJ SOLN
INTRAMUSCULAR | Status: DC | PRN
  Administered 2017-01-12: 10 mg via INTRAVENOUS

## 2017-01-12 MED ORDER — SUGAMMADEX SODIUM 200 MG/2ML IV SOLN
INTRAVENOUS | Status: AC
  Filled 2017-01-12: qty 2

## 2017-01-12 MED ORDER — OXYCODONE-ACETAMINOPHEN 5-325 MG PO TABS
ORAL_TABLET | ORAL | Status: AC
  Filled 2017-01-12: qty 1

## 2017-01-12 MED ORDER — KETOROLAC TROMETHAMINE 30 MG/ML IJ SOLN
INTRAMUSCULAR | Status: DC | PRN
  Administered 2017-01-12: 30 mg via INTRAVENOUS

## 2017-01-12 MED ORDER — FENTANYL CITRATE (PF) 100 MCG/2ML IJ SOLN
INTRAMUSCULAR | Status: AC
  Filled 2017-01-12: qty 2

## 2017-01-12 MED ORDER — TRIAMCINOLONE ACETONIDE 55 MCG/ACT NA AERO
2.0000 | INHALATION_SPRAY | Freq: Every day | NASAL | Status: DC
  Filled 2017-01-12: qty 21.6

## 2017-01-12 MED ORDER — ROCURONIUM BROMIDE 50 MG/5ML IV SOSY
PREFILLED_SYRINGE | INTRAVENOUS | Status: AC
  Filled 2017-01-12: qty 5

## 2017-01-12 MED ORDER — HYDROMORPHONE HCL 1 MG/ML IJ SOLN
0.2500 mg | INTRAMUSCULAR | Status: DC | PRN
  Administered 2017-01-12 (×4): 0.5 mg via INTRAVENOUS
  Filled 2017-01-12: qty 0.5

## 2017-01-12 MED ORDER — PROPOFOL 10 MG/ML IV BOLUS
INTRAVENOUS | Status: DC | PRN
  Administered 2017-01-12: 100 mg via INTRAVENOUS
  Administered 2017-01-12: 200 mg via INTRAVENOUS

## 2017-01-12 MED ORDER — MIDAZOLAM HCL 5 MG/5ML IJ SOLN
INTRAMUSCULAR | Status: DC | PRN
  Administered 2017-01-12: 1 mg via INTRAVENOUS
  Administered 2017-01-12: 2 mg via INTRAVENOUS
  Administered 2017-01-12: 1 mg via INTRAVENOUS

## 2017-01-12 MED ORDER — IBUPROFEN 200 MG PO TABS
ORAL_TABLET | ORAL | Status: AC
  Filled 2017-01-12: qty 3

## 2017-01-12 MED ORDER — HYDROMORPHONE HCL 1 MG/ML IJ SOLN
0.2000 mg | INTRAMUSCULAR | Status: DC | PRN
  Administered 2017-01-12 – 2017-01-13 (×2): 0.5 mg via INTRAVENOUS
  Filled 2017-01-12: qty 1

## 2017-01-12 MED ORDER — CEFAZOLIN SODIUM-DEXTROSE 2-4 GM/100ML-% IV SOLN
INTRAVENOUS | Status: AC
  Filled 2017-01-12: qty 100

## 2017-01-12 MED ORDER — LACTATED RINGERS IV SOLN
INTRAVENOUS | Status: DC
  Administered 2017-01-12: 13:00:00 via INTRAVENOUS
  Filled 2017-01-12 (×2): qty 1000

## 2017-01-12 SURGICAL SUPPLY — 58 items
ADH SKN CLS APL DERMABOND .7 (GAUZE/BANDAGES/DRESSINGS) ×1
BLADE CLIPPER SURG (BLADE) IMPLANT
CANISTER SUCT 3000ML PPV (MISCELLANEOUS) ×2 IMPLANT
CATH ROBINSON RED A/P 16FR (CATHETERS) ×2 IMPLANT
CLOTH BEACON ORANGE TIMEOUT ST (SAFETY) ×2 IMPLANT
COVER BACK TABLE 60X90IN (DRAPES) ×4 IMPLANT
COVER MAYO STAND STRL (DRAPES) ×4 IMPLANT
COVER SURGICAL LIGHT HANDLE (MISCELLANEOUS) ×1 IMPLANT
DECANTER SPIKE VIAL GLASS SM (MISCELLANEOUS) IMPLANT
DERMABOND ADVANCED (GAUZE/BANDAGES/DRESSINGS) ×1
DERMABOND ADVANCED .7 DNX12 (GAUZE/BANDAGES/DRESSINGS) ×1 IMPLANT
DRSG OPSITE POSTOP 3X4 (GAUZE/BANDAGES/DRESSINGS) ×3 IMPLANT
DURAPREP 26ML APPLICATOR (WOUND CARE) ×2 IMPLANT
ELECT REM PT RETURN 9FT ADLT (ELECTROSURGICAL) ×2
ELECTRODE REM PT RTRN 9FT ADLT (ELECTROSURGICAL) ×1 IMPLANT
GLOVE BIO SURGEON STRL SZ8 (GLOVE) ×6 IMPLANT
GLOVE ECLIPSE 6.5 STRL STRAW (GLOVE) ×4 IMPLANT
GOWN STRL REUS W/TWL XL LVL3 (GOWN DISPOSABLE) ×4 IMPLANT
HOLDER FOLEY CATH W/STRAP (MISCELLANEOUS) ×2 IMPLANT
KIT RM TURNOVER CYSTO AR (KITS) ×2 IMPLANT
LEGGING LITHOTOMY PAIR STRL (DRAPES) ×2 IMPLANT
LIGASURE IMPACT 36 18CM CVD LR (INSTRUMENTS) IMPLANT
NDL INSUFFLATION 14GA 120MM (NEEDLE) ×1 IMPLANT
NDL INSUFFLATION 14GA 150MM (NEEDLE) IMPLANT
NEEDLE INSUFFLATION 14GA 120MM (NEEDLE) ×2 IMPLANT
NEEDLE INSUFFLATION 14GA 150MM (NEEDLE) ×2 IMPLANT
NS IRRIG 500ML POUR BTL (IV SOLUTION) ×2 IMPLANT
PACK LAVH (CUSTOM PROCEDURE TRAY) ×2 IMPLANT
PACK ROBOTIC GOWN (GOWN DISPOSABLE) ×3 IMPLANT
PACK TRENDGUARD 450 HYBRID PRO (MISCELLANEOUS) IMPLANT
PACK TRENDGUARD 600 HYBRD PROC (MISCELLANEOUS) IMPLANT
PAD OB MATERNITY 4.3X12.25 (PERSONAL CARE ITEMS) ×2 IMPLANT
PAD PREP 24X48 CUFFED NSTRL (MISCELLANEOUS) ×2 IMPLANT
SCISSORS LAP 5X35 DISP (ENDOMECHANICALS) IMPLANT
SCISSORS LAP 5X45 EPIX DISP (ENDOMECHANICALS) IMPLANT
SEALER TISSUE G2 CVD JAW 45CM (ENDOMECHANICALS) ×2 IMPLANT
SET IRRIG TUBING LAPAROSCOPIC (IRRIGATION / IRRIGATOR) IMPLANT
SOLUTION ELECTROLUBE (MISCELLANEOUS) IMPLANT
SUT MNCRL 0 MO-4 VIOLET 18 CR (SUTURE) ×2 IMPLANT
SUT MNCRL 0 VIOLET 6X18 (SUTURE) IMPLANT
SUT MNCRL AB 0 CT1 27 (SUTURE) ×1 IMPLANT
SUT MON AB-0 CT1 36 (SUTURE) IMPLANT
SUT MONOCRYL 0 6X18 (SUTURE) ×1
SUT MONOCRYL 0 MO 4 18  CR/8 (SUTURE) ×2
SUT VICRYL 0 UR6 27IN ABS (SUTURE) ×2 IMPLANT
SUT VICRYL 4-0 PS2 18IN ABS (SUTURE) ×2 IMPLANT
SYR 30ML LL (SYRINGE) ×2 IMPLANT
SYR BULB IRRIGATION 50ML (SYRINGE) IMPLANT
TOWEL OR 17X24 6PK STRL BLUE (TOWEL DISPOSABLE) ×4 IMPLANT
TRAY FOLEY CATH SILVER 14FR (SET/KITS/TRAYS/PACK) ×2 IMPLANT
TRENDGUARD 450 HYBRID PRO PACK (MISCELLANEOUS) ×2
TRENDGUARD 600 HYBRID PROC PK (MISCELLANEOUS)
TROCAR 12M 150ML BLUNT (TROCAR) ×1 IMPLANT
TROCAR 5M 150ML BLDLS (TROCAR) ×1 IMPLANT
TROCAR XCEL NON-BLD 11X100MML (ENDOMECHANICALS) ×2 IMPLANT
TROCAR XCEL NON-BLD 5MMX100MML (ENDOMECHANICALS) ×2 IMPLANT
TUBING INSUF HEATED (TUBING) ×2 IMPLANT
WARMER LAPAROSCOPE (MISCELLANEOUS) ×2 IMPLANT

## 2017-01-12 NOTE — Anesthesia Preprocedure Evaluation (Addendum)
Anesthesia Evaluation  Patient identified by MRN, date of birth, ID band Patient awake    Reviewed: Allergy & Precautions, NPO status , Patient's Chart, lab work & pertinent test results  Airway Mallampati: II  TM Distance: >3 FB     Dental   Pulmonary Current Smoker,    breath sounds clear to auscultation       Cardiovascular + angina  Rhythm:Regular Rate:Normal     Neuro/Psych  Headaches,    GI/Hepatic negative GI ROS, Neg liver ROS,   Endo/Other  negative endocrine ROS  Renal/GU negative Renal ROS     Musculoskeletal   Abdominal   Peds  Hematology   Anesthesia Other Findings   Reproductive/Obstetrics                            Anesthesia Physical Anesthesia Plan  ASA: III  Anesthesia Plan: General   Post-op Pain Management:    Induction: Intravenous  PONV Risk Score and Plan: 2 and Ondansetron, Dexamethasone, Propofol infusion, Midazolam and Treatment may vary due to age or medical condition  Airway Management Planned:   Additional Equipment:   Intra-op Plan:   Post-operative Plan: Possible Post-op intubation/ventilation and Extubation in OR  Informed Consent: I have reviewed the patients History and Physical, chart, labs and discussed the procedure including the risks, benefits and alternatives for the proposed anesthesia with the patient or authorized representative who has indicated his/her understanding and acceptance.   Dental advisory given  Plan Discussed with: CRNA, Anesthesiologist and Surgeon  Anesthesia Plan Comments:        Anesthesia Quick Evaluation

## 2017-01-12 NOTE — Anesthesia Procedure Notes (Signed)
Procedure Name: Intubation Performed by: Justice Rocher Pre-anesthesia Checklist: Patient identified, Emergency Drugs available, Suction available and Patient being monitored Patient Re-evaluated:Patient Re-evaluated prior to induction Oxygen Delivery Method: Circle system utilized Preoxygenation: Pre-oxygenation with 100% oxygen Induction Type: IV induction Ventilation: Mask ventilation without difficulty Laryngoscope Size: Mac and 4 Grade View: Grade II Tube type: Oral Tube size: 7.0 mm Number of attempts: 1 Airway Equipment and Method: Stylet and Oral airway Placement Confirmation: ETT inserted through vocal cords under direct vision,  positive ETCO2 and breath sounds checked- equal and bilateral Secured at: 23 cm Tube secured with: Tape Dental Injury: Teeth and Oropharynx as per pre-operative assessment

## 2017-01-12 NOTE — Progress Notes (Signed)
01/12/2017  9:24 AM  PATIENT:  Mary Harrington  31 y.o. female  PRE-OPERATIVE DIAGNOSIS:  menorrhagia, dysmenorhea  POST-OPERATIVE DIAGNOSIS:  menorrhagia, dysmenorhea, endometriosis  PROCEDURE:  Procedure(s): LAPAROSCOPIC ASSISTED VAGINAL HYSTERECTOMY WITH bilateral SALPINGECTOMY and ablation of endometriosis (Bilateral)  SURGEON:  Surgeon(s) and Role:    * Everlene Farrier, MD - Primary  PHYSICIAN ASSISTANT: Lucillie Garfinkel MD  ASSISTANTS: see above  ANESTHESIA:   general  EBL:  Total I/O In: 1000 [I.V.:1000] Out: 150 [Blood:150]  BLOOD ADMINISTERED:none  DRAINS: Urinary Catheter (Foley)   LOCAL MEDICATIONS USED:  MARCAINE    and Amount: 30 ml  SPECIMEN:  Source of Specimen:  uterus, bilateral tubes  DISPOSITION OF SPECIMEN:  PATHOLOGY  COUNTS:  YES  TOURNIQUET:  * No tourniquets in log *  DICTATION: .Other Dictation: Dictation Number [id number]  PLAN OF CARE: Admit for overnight observation  PATIENT DISPOSITION:  PACU - hemodynamically stable.   Delay start of Pharmacological VTE agent (>24hrs) due to surgical blood loss or risk of bleeding: not applicable

## 2017-01-12 NOTE — Progress Notes (Signed)
Ambulating , tolerating liquids, good pain relief  Vitals:   01/12/17 1424 01/12/17 1611  BP: 116/82 112/70  Pulse: 89 (!) 49  Resp: 17 17  Temp: 97.6 F (36.4 C) 98.3 F (36.8 C)  SpO2: 97% 100%   Lungs CTA Cor RRR Abd soft, BS + LE-PAS on  UO clear  A/P: Stable         Per Orders

## 2017-01-12 NOTE — Anesthesia Postprocedure Evaluation (Signed)
Anesthesia Post Note  Patient: Mary Harrington  Procedure(s) Performed: Procedure(s) (LRB): LAPAROSCOPIC ASSISTED VAGINAL HYSTERECTOMY WITH bilateral SALPINGECTOMY and ablation of endometriosis (Bilateral)     Patient location during evaluation: PACU Anesthesia Type: General Level of consciousness: awake Pain management: pain level controlled Vital Signs Assessment: post-procedure vital signs reviewed and stable Respiratory status: spontaneous breathing Cardiovascular status: stable Postop Assessment: no signs of nausea or vomiting Anesthetic complications: no    Last Vitals:  Vitals:   01/12/17 0935 01/12/17 0945  BP: 130/69 129/75  Pulse: 74 72  Resp: 16 18  Temp: 36.6 C   SpO2: 100% 100%    Last Pain:  Vitals:   01/12/17 0603  TempSrc: Oral                 Myan Locatelli

## 2017-01-12 NOTE — Transfer of Care (Signed)
Immediate Anesthesia Transfer of Care Note  Patient: Mary Harrington  Procedure(s) Performed: Procedure(s) (LRB): LAPAROSCOPIC ASSISTED VAGINAL HYSTERECTOMY WITH bilateral SALPINGECTOMY and ablation of endometriosis (Bilateral)  Patient Location: PACU  Anesthesia Type: General  Level of Consciousness: awake, sedated, patient cooperative and responds to stimulation  Airway & Oxygen Therapy: Patient Spontanous Breathing and Patient connected to [place] O2   Post-op Assessment: Report given to PACU RN, Post -op Vital signs reviewed and stable and Patient moving all extremities  Post vital signs: Reviewed and stable  Complications: No apparent anesthesia complications

## 2017-01-12 NOTE — Progress Notes (Signed)
No changes to H&P per patient history Reviewed with patient procedure-LAVH/BS and removal of ovary if abnormal All questions answered She states she understands and agrees

## 2017-01-13 DIAGNOSIS — N800 Endometriosis of uterus: Secondary | ICD-10-CM | POA: Diagnosis not present

## 2017-01-13 DIAGNOSIS — N920 Excessive and frequent menstruation with regular cycle: Secondary | ICD-10-CM | POA: Diagnosis not present

## 2017-01-13 DIAGNOSIS — F17210 Nicotine dependence, cigarettes, uncomplicated: Secondary | ICD-10-CM | POA: Diagnosis not present

## 2017-01-13 DIAGNOSIS — Z79899 Other long term (current) drug therapy: Secondary | ICD-10-CM | POA: Diagnosis not present

## 2017-01-13 DIAGNOSIS — N946 Dysmenorrhea, unspecified: Secondary | ICD-10-CM | POA: Diagnosis not present

## 2017-01-13 LAB — CBC
HCT: 32.3 % — ABNORMAL LOW (ref 36.0–46.0)
Hemoglobin: 10.9 g/dL — ABNORMAL LOW (ref 12.0–15.0)
MCH: 27.6 pg (ref 26.0–34.0)
MCHC: 33.7 g/dL (ref 30.0–36.0)
MCV: 81.8 fL (ref 78.0–100.0)
PLATELETS: 211 10*3/uL (ref 150–400)
RBC: 3.95 MIL/uL (ref 3.87–5.11)
RDW: 13.4 % (ref 11.5–15.5)
WBC: 10.2 10*3/uL (ref 4.0–10.5)

## 2017-01-13 MED ORDER — IBUPROFEN 200 MG PO TABS
ORAL_TABLET | ORAL | Status: AC
  Filled 2017-01-13: qty 3

## 2017-01-13 MED ORDER — OXYCODONE-ACETAMINOPHEN 5-325 MG PO TABS: 1.0000 | ORAL_TABLET | Freq: Four times a day (QID) | ORAL | 0 refills | Status: DC | PRN

## 2017-01-13 MED ORDER — OXYCODONE-ACETAMINOPHEN 5-325 MG PO TABS
ORAL_TABLET | ORAL | Status: AC
  Filled 2017-01-13: qty 1

## 2017-01-13 MED ORDER — IBUPROFEN 200 MG PO TABS
ORAL_TABLET | ORAL | Status: AC
Start: 2017-01-13 — End: ?
  Filled 2017-01-13: qty 3

## 2017-01-13 MED ORDER — IBUPROFEN 600 MG PO TABS: 600.0000 mg | ORAL_TABLET | Freq: Four times a day (QID) | ORAL | 0 refills | Status: DC | PRN

## 2017-01-13 NOTE — Op Note (Signed)
Mary Harrington, Mary Harrington              ACCOUNT NO.:  [id number]  MEDICAL RECORD NO.:  [id number]  LOCATION:                                 FACILITY:  PHYSICIAN:  Daleen Bo. Gaetano Net, M.D.      DATE OF BIRTH:  DATE OF CONSULTATION:  01/12/2017 DATE OF DISCHARGE:                                CONSULTATION   LOCATION:  [facility], [place], [place].  PREOPERATIVE DIAGNOSES: 1. Menorrhagia. 2. Dysmenorrhea.  POSTOPERATIVE DIAGNOSES: 1. Menorrhagia. 2. Endometriosis.  PROCEDURE:  Laparoscopically-assisted vaginal hysterectomy with bilateral salpingectomy and ablation of endometriosis.  SURGEON:  Daleen Bo. Gaetano Net, M.D.  ASSISTANT:  Lucillie Garfinkel, MD  ANESTHESIA:  General with endotracheal intubation, Dr. Nyoka Cowden.  ESTIMATED BLOOD LOSS:  150 mL.  SPECIMENS:  Uterus and bilateral fallopian tubes to Pathology.  INDICATIONS AND CONSENT:  This patient is a 31 year old G2, P2, status post tubal ligation with heavy painful menses.  Details are dictated in the history and physical.  Laparoscopically-assisted vaginal hysterectomy with bilateral salpingectomy and removal of an ovary only if distinctly abnormal have been discussed preoperatively.  Potential risks and complications have been discussed preoperatively including, but not limited to, infection, organ damage, bleeding requiring transfusion of blood products with HIV and hepatitis acquisition, DVT, PE, pneumonia, fistula formation, laparotomy, return to OR, pelvic and abdominal pain, and painful intercourse.  The patient states she understands and agrees, and consent was signed on the chart.  FINDINGS:  Upper abdomen is grossly normal.  In the pelvis, the uterus is about 8-week size.  Anterior cul-de-sac is normal.  Fallopian tubes have Filshie clips bilaterally but otherwise normal.  Ovaries were normal bilaterally.  On the cervical insertion of the left uterosacral ligament, there is a dark  black implant of endometriosis.  DESCRIPTION OF PROCEDURE:  The patient was taken to the operating room where she was identified and placed in dorsal supine position, and general anesthesia was induced via endotracheal intubation.  She was placed in a dorsal lithotomy position.  Time-out was undertaken.  She was prepped abdominally with DuraPrep, vaginally with Betadine.  Bladder was straight catheterized, and a tenaculum was placed in the uterus as a manipulator.  After a 3-minute drying time, the patient was draped in a sterile fashion.  The infraumbilical and suprapubic areas were injected with 0.5% plain Marcaine, about 6 mL total.  The Veress needle was placed, and 2 L of gas was then insufflated.  The Veress needle was removed, and the 10/11 Xcel bladeless disposable trocar sleeve was placed under direct visualization.  It was noted the insufflation was retroperitoneal.  Therefore, the trocar sleeve was removed.  Then, using a long Veress needle, it was successfully placed on the first attempt with a good syringe and drop test noted.  2 L of gas was then insufflated under low pressure with good tympany in the right upper quadrant.  Veress needle was removed and a 10/11 Xcel bladeless disposable trocar sleeve was then placed using direct visualization. Careful inspection all around revealed no damage to tissues and the above findings.  A small suprapubic incision was made, and a 5 mm disposable trocar sleeve was placed under  direct visualization without difficulty.  Then, using the EnSeal bipolar cautery cutting instrument, the right fallopian tube was taken down coming across the proximal round ligament and utero-ovarian ligament down the level of vesicouterine peritoneum.  On the left side, the proximal ligaments were taken down to the level of vesicouterine peritoneum.  Vesicouterine peritoneum was then taken down cephalolaterally.  Hemostasis was assured.  Suprapubic trocar  sleeve was removed, instruments were removed, and attention was turned to the vagina.  Posterior cul-de-sac was entered sharply, and the cervix was circumscribed with unipolar cautery.  Mucosa was advanced bluntly and sharply.  Using the handheld LigaSure bipolar cautery cutting instrument, the uterosacral ligaments and bladder pillars were taken down bilaterally.  Anterior cul-de-sac was entered without difficulty.  Proximal ligaments and vessels were then taken down. Fundus was delivered posteriorly, and the specimen was delivered without difficulty.  All suture will be 0 Monocryl unless otherwise designated. Uterosacral ligaments were plicated with the cuff bilaterally with separate sutures.  Suture was also placed at the 3 and 9 o'clock positions for hemostasis.  Uterosacral ligaments were then plicated in the midline with a third suture.  Cuff was closed with figure-of-eights. Foley catheter was placed in the bladder and clear urine was noted. Attention was returned to the abdomen.  Pneumoperitoneum was reintroduced.  The suprapubic trocar sleeve was reintroduced, and copious irrigation was carried out.  Minor bleeding at peritoneal edges was controlled with bipolar cautery.  The left fallopian tube was also removed without difficulty and retrieved through the umbilical trocar sleeve, and good hemostasis was again noted.  The remaining 0.5% plain Marcaine, about 24 mL was placed into the pelvis.  Suprapubic trocar sleeve was removed.  Pneumoperitoneum was reduced.  The umbilical trocar sleeve was removed.  The umbilical incision was closed with 0 Vicryl in the subcutaneous layer under good visualization, and the skin on both was closed with interrupted 4-0 Vicryl.  Glue was applied.  All counts were correct.  The patient was awakened and taken to recovery room in stable condition.     Daleen Bo Gaetano Net, M.D.   ______________________________ Daleen Bo. Gaetano Net,  M.D.    JET/MEDQ  D:  01/12/2017  T:  01/13/2017  Job:  [id number]

## 2017-01-13 NOTE — Discharge Summary (Signed)
Physician Discharge Summary  Patient ID: Mary Harrington MRN: [id number] DOB/AGE: Apr 18, 1986 31 y.o.  Admit date: 01/12/2017 Discharge date: 01/13/2017  Admission Diagnoses:menorrhagia  Discharge Diagnoses:  Active Problems:   Menorrhagia   Discharged Condition: good  Hospital Course: post operatively had good resumption of bowel function with flatus, voiding, ambulating, tolerating regular diet, good pain relief.  Consults: None  Significant Diagnostic Studies: labs:  Results for orders placed or performed during the hospital encounter of 01/12/17 (from the past 24 hour(s))  CBC     Status: Abnormal   Collection Time: 01/13/17  5:46 AM  Result Value Ref Range   WBC 10.2 4.0 - 10.5 K/uL   RBC 3.95 3.87 - 5.11 MIL/uL   Hemoglobin 10.9 (L) 12.0 - 15.0 g/dL   HCT 32.3 (L) 36.0 - 46.0 %   MCV 81.8 78.0 - 100.0 fL   MCH 27.6 26.0 - 34.0 pg   MCHC 33.7 30.0 - 36.0 g/dL   RDW 13.4 11.5 - 15.5 %   Platelets 211 150 - 400 K/uL    Treatments: surgery: LAVH/BS  Discharge Exam: Blood pressure 102/61, pulse 71, temperature 98.4 F (36.9 C), temperature source Oral, resp. rate 16, height 5\' 3"  (1.6 m), weight 203 lb 8 oz (92.3 kg), last menstrual period 01/01/2017, SpO2 100 %. General appearance: alert, cooperative and no distress GI: soft, non-tender; bowel sounds normal; no masses,  no organomegaly  Disposition: 01-Home or Self Care  Discharge Instructions    Discharge patient    Complete by:  As directed    Discharge disposition:  01-Home or Self Care   Discharge patient date:  01/13/2017     Allergies as of 01/13/2017   No Known Allergies     Medication List    TAKE these medications   cetirizine 10 MG tablet Commonly known as:  ZYRTEC Take 1 tablet (10 mg total) by mouth daily.   ibuprofen 600 MG tablet Commonly known as:  ADVIL,MOTRIN Take 1 tablet (600 mg total) by mouth every 6 (six) hours as needed (mild pain). What changed:  reasons to take this    oxyCODONE-acetaminophen 5-325 MG tablet Commonly known as:  PERCOCET/ROXICET Take 1-2 tablets by mouth every 6 (six) hours as needed (moderate to severe pain (when tolerating fluids)).   triamcinolone 55 MCG/ACT Aero nasal inhaler Commonly known as:  NASACORT Place 2 sprays into the nose daily.        Signed: Shamari Trostel II,Javel Hersh E 01/13/2017, 8:30 AM

## 2017-01-13 NOTE — Discharge Instructions (Signed)
Abdominal Hysterectomy °Abdominal hysterectomy is a surgical procedure to remove the womb (uterus). The uterus is the muscular organ that houses a developing baby. This surgery may be done if: °· You have cancer. °· You have growths (tumors or fibroids) in the uterus. °· You have long-term (chronic) pain. °· You are bleeding. °· Your uterus has slipped down into your vagina (uterine prolapse). °· You have a condition in which the tissue that lines the uterus grows outside of its normal location (endometriosis). °· You have an infection in your uterus. °· You are having problems with your menstrual cycle. ° °Depending on why you are having this procedure, you may also have other reproductive organs removed. These could include: °· The part of your vagina that connects with your uterus (cervix). °· The organs that make eggs (ovaries). °· The tubes that connect the ovaries to the uterus (fallopian tubes). ° °Tell a health care provider about: °· Any allergies you have. °· All medicines you are taking, including vitamins, herbs, eye drops, creams, and over-the-counter medicines. °· Any problems you or family members have had with anesthetic medicines. °· Any blood disorders you have. °· Any surgeries you have had. °· Any medical conditions you have. °· Whether you are pregnant or may be pregnant. °What are the risks? °Generally, this is a safe procedure. However, problems may occur, including: °· Bleeding. °· Infection. °· Allergic reactions to medicines or dyes. °· Damage to other structures or organs. °· Nerve injury. °· Decreased interest in sex or pain during sex. °· Blood clots that can break free and travel to your lungs. ° °What happens before the procedure? °Staying hydrated °Follow instructions from your health care provider about hydration, which may include: °· Up to 2 hours before the procedure - you may continue to drink clear liquids, such as water, clear fruit juice, black coffee, and plain tea ° °Eating  and drinking restrictions °Follow instructions from your health care provider about eating and drinking, which may include: °· 8 hours before the procedure - stop eating heavy meals or foods such as meat, fried foods, or fatty foods. °· 6 hours before the procedure - stop eating light meals or foods, such as toast or cereal. °· 6 hours before the procedure - stop drinking milk or drinks that contain milk. °· 2 hours before the procedure - stop drinking clear liquids. ° °Medicines °· Ask your health care provider about: °? Changing or stopping your regular medicines. This is especially important if you are taking diabetes medicines or blood thinners. °? Taking medicines such as aspirin and ibuprofen. These medicines can thin your blood. Do not take these medicines before your procedure if your health care provider instructs you not to. °· You may be given antibiotic medicine to help prevent infection. Take it as told by your health care provider. °· You may be asked to take laxatives to prevent constipation. °General instructions °· Ask your health care provider how your surgical site will be marked or identified. °· You may be asked to shower with a germ-killing soap. °· Plan to have someone take you home from the hospital. °· Do not use any products that contain nicotine or tobacco, such as cigarettes and e-cigarettes. If you need help quitting, ask your health care provider. °· You may have an exam or testing. °· You may have a blood or urine sample taken. °· You may need to have an enema to clean out your rectum and lower colon. °· This   procedure can affect the way you feel about yourself. Talk to your health care provider about the physical and emotional changes this procedure may cause. °What happens during the procedure? °· To lower your risk of infection: °? Your health care team will wash or sanitize their hands. °? Your skin will be washed with soap. °? Hair may be removed from the surgical area. °· An IV  tube will be inserted into one of your veins. °· You will be given one or more of the following: °? A medicine to help you relax (sedative). °? A medicine to make you fall asleep (general anesthetic). °· Tight-fitting (compression) stockings will be placed on your legs to promote circulation. °· A thin, flexible tube (catheter) will be inserted to help drain your urine. °· The surgeon will make a cut (incision) through the skin in your lower belly. The incision may go side-to-side or up-and-down. °· The surgeon will move aside the body tissue that covers your uterus. The surgeon will then carefully take out your uterus along with any of the other organs that need to be removed. °· Bleeding will be controlled with clamps or sutures. °· The surgeon will close your incision with stitches (sutures), skin glue, or adhesive strips. °· A bandage (dressing) will be placed over the incision. °The procedure may vary among health care providers and hospitals. °What happens after the procedure? °· You will be given pain medicine as needed. °· Your blood pressure, heart rate, breathing rate, and blood oxygen level will be monitored until the medicines you were given have worn off. °· You will need to stay in the hospital to recover for one to two days. Ask your health care provider how long you will need to stay in the hospital after your procedure. °· You may have a liquid diet at first. You will most likely return to your usual diet the day after surgery. °· You will still have the urinary catheter in place. It will likely be removed the day after surgery. °· You may have to wear compression stockings. These stockings help to prevent blood clots and reduce swelling in your legs. °· You will be encouraged to walk as soon as possible. You will also use a device or do breathing exercises to keep your lungs clear. °· You may need to use a sanitary napkin for vaginal discharge. °Summary °· Abdominal hysterectomy is a surgical  procedure to remove the womb (uterus). The uterus is the muscular organ that houses a developing baby. °· This procedure can affect the way you feel about yourself. Talk to your health care provider about the physical and emotional changes this procedure may cause. °· You will be given medicines for pain after the procedure. °· You will need to stay in the hospital to recover. Ask your health care provider how long you will need to stay in the hospital after your procedure. °This information is not intended to replace advice given to you by your health care provider. Make sure you discuss any questions you have with your health care provider. °Document Released: 05/17/2013 Document Revised: 04/30/2016 Document Reviewed: 04/30/2016 °[employer] Interactive Patient Education © 2017 [employer] ° °

## 2017-01-14 ENCOUNTER — Encounter (HOSPITAL_BASED_OUTPATIENT_CLINIC_OR_DEPARTMENT_OTHER): Payer: Self-pay | Admitting: Obstetrics and Gynecology

## 2017-02-03 ENCOUNTER — Other Ambulatory Visit: Payer: 59

## 2017-02-06 ENCOUNTER — Ambulatory Visit
Admission: RE | Admit: 2017-02-06 | Discharge: 2017-02-06 | Disposition: A | Discharge status: Home / Self Care | Payer: 59 | Source: Ambulatory Visit | Attending: Obstetrics and Gynecology | Admitting: Obstetrics and Gynecology

## 2017-02-06 ENCOUNTER — Other Ambulatory Visit: Payer: Self-pay | Admitting: Obstetrics and Gynecology

## 2017-02-06 DIAGNOSIS — N6310 Unspecified lump in the right breast, unspecified quadrant: Secondary | ICD-10-CM

## 2017-02-06 DIAGNOSIS — R928 Other abnormal and inconclusive findings on diagnostic imaging of breast: Secondary | ICD-10-CM | POA: Diagnosis not present

## 2017-02-06 DIAGNOSIS — N6489 Other specified disorders of breast: Secondary | ICD-10-CM | POA: Diagnosis not present

## 2017-03-25 DIAGNOSIS — H5213 Myopia, bilateral: Secondary | ICD-10-CM | POA: Diagnosis not present

## 2017-03-25 DIAGNOSIS — H52202 Unspecified astigmatism, left eye: Secondary | ICD-10-CM | POA: Diagnosis not present

## 2017-08-25 ENCOUNTER — Other Ambulatory Visit: Payer: Self-pay | Admitting: Obstetrics and Gynecology

## 2017-08-25 ENCOUNTER — Ambulatory Visit: Payer: 59 | Admitting: Nurse Practitioner

## 2017-08-25 ENCOUNTER — Encounter: Payer: Self-pay | Admitting: Nurse Practitioner

## 2017-08-25 ENCOUNTER — Ambulatory Visit
Admission: RE | Admit: 2017-08-25 | Discharge: 2017-08-25 | Disposition: A | Discharge status: Home / Self Care | Payer: 59 | Source: Ambulatory Visit | Attending: Obstetrics and Gynecology | Admitting: Obstetrics and Gynecology

## 2017-08-25 ENCOUNTER — Ambulatory Visit: Payer: BLUE CROSS/BLUE SHIELD | Admitting: Nurse Practitioner

## 2017-08-25 VITALS — BP 110/72 | HR 76 | Temp 99.3°F | Ht 65.25 in | Wt 218.2 lb

## 2017-08-25 DIAGNOSIS — Z6836 Body mass index (BMI) 36.0-36.9, adult: Secondary | ICD-10-CM | POA: Diagnosis not present

## 2017-08-25 DIAGNOSIS — R5383 Other fatigue: Secondary | ICD-10-CM | POA: Diagnosis not present

## 2017-08-25 DIAGNOSIS — Z136 Encounter for screening for cardiovascular disorders: Secondary | ICD-10-CM

## 2017-08-25 DIAGNOSIS — E559 Vitamin D deficiency, unspecified: Secondary | ICD-10-CM

## 2017-08-25 DIAGNOSIS — F4323 Adjustment disorder with mixed anxiety and depressed mood: Secondary | ICD-10-CM

## 2017-08-25 DIAGNOSIS — R739 Hyperglycemia, unspecified: Secondary | ICD-10-CM

## 2017-08-25 DIAGNOSIS — J309 Allergic rhinitis, unspecified: Secondary | ICD-10-CM | POA: Diagnosis not present

## 2017-08-25 DIAGNOSIS — R928 Other abnormal and inconclusive findings on diagnostic imaging of breast: Secondary | ICD-10-CM | POA: Diagnosis not present

## 2017-08-25 DIAGNOSIS — E669 Obesity, unspecified: Secondary | ICD-10-CM | POA: Diagnosis not present

## 2017-08-25 DIAGNOSIS — H1013 Acute atopic conjunctivitis, bilateral: Secondary | ICD-10-CM | POA: Insufficient documentation

## 2017-08-25 DIAGNOSIS — Z13220 Encounter for screening for lipoid disorders: Secondary | ICD-10-CM

## 2017-08-25 DIAGNOSIS — N6489 Other specified disorders of breast: Secondary | ICD-10-CM

## 2017-08-25 DIAGNOSIS — Z0001 Encounter for general adult medical examination with abnormal findings: Secondary | ICD-10-CM

## 2017-08-25 DIAGNOSIS — N6310 Unspecified lump in the right breast, unspecified quadrant: Secondary | ICD-10-CM

## 2017-08-25 DIAGNOSIS — Z862 Personal history of diseases of the blood and blood-forming organs and certain disorders involving the immune mechanism: Secondary | ICD-10-CM | POA: Insufficient documentation

## 2017-08-25 MED ORDER — OLOPATADINE HCL 0.2 % OP SOLN: 1.0000 [drp] | Freq: Every day | OPHTHALMIC | 0 refills | Status: DC

## 2017-08-25 MED ORDER — CETIRIZINE HCL 10 MG PO TABS: 10.0000 mg | ORAL_TABLET | Freq: Every day | ORAL | 11 refills | Status: DC

## 2017-08-25 MED ORDER — TRIAMCINOLONE ACETONIDE 55 MCG/ACT NA AERO
2.0000 | INHALATION_SPRAY | Freq: Every day | NASAL | 12 refills | Status: DC
Start: 2017-08-25 — End: 2018-12-14

## 2017-08-25 NOTE — Progress Notes (Signed)
Subjective:    Patient ID: Mary Harrington, female    DOB: June 15, 1985, 32 y.o.   MRN: [id number]  Patient presents today for complete physical  HPI Mary Harrington is transferring care from [place]. She is here for CPE, but also has concerns about weight gain and fatigue. She request for labs to be drawn and processed by [facility].  Depression and anxiety: Does not want to take medication at this time Will think about counseling sessions. Depression screen [facility] 2/9 08/25/2017 08/25/2017 08/25/2017  Decreased Interest 2 2 0  Down, Depressed, Hopeless 1 1 2   PHQ - 2 Score 3 3 2   Altered sleeping 3 3 -  Tired, decreased energy 3 3 -  Change in appetite 2 2 -  Feeling bad or failure about yourself  0 0 -  Trouble concentrating 1 1 -  Moving slowly or fidgety/restless 1 1 -  Suicidal thoughts 0 0 -  PHQ-9 Score 13 13 -   GAD 7 : Generalized Anxiety Score 08/25/2017  Nervous, Anxious, on Edge 1  Control/stop worrying 1  Worry too much - different things 2  Trouble relaxing 2  Restless 0  Easily annoyed or irritable 3  Afraid - awful might happen 0  Total GAD 7 Score 9    Tobacco use Disorder: Cutting down, Current use of 4cig per day. Most consumed 1/2ppd x 80yrs. Never tried to quit.  Chronic watery, Itching and redness of eyes, last eye exam by ophthalmology last month. No abnormal finding pr patient, use of corrective lens  Reports R. Breast mass:  repeat mammogram done yesterday, done by [facility] in [place]. Followed by Dr. Gaetano Net (GYN) Benign finding. Scheduled for repeat diagnostic mammogram and [place] in 35months  Immunizations: (TDAP, Hep C screen, Pneumovax, Influenza, zoster)  Health Maintenance  Topic Date Due  . Pap Smear  11/14/2017  . Flu Shot  12/24/2017  . Tetanus Vaccine  06/26/2024  . HIV Screening  Completed   Diet:regular.  Weight:  Wt Readings from Last 3 Encounters:  08/25/17 218 lb 3.2 oz (99 kg)  01/12/17 203 lb 8 oz (92.3 kg)  01/07/17 202 lb  (91.6 kg)   Exercise:none.  Fall Risk: Fall Risk  08/25/2017  Falls in the past year? No   Home Safety:home with children.  Pap Smear (every 60yrs for >21-29 without HPV, every 17yrs for >30-52yrs with HPV):s/p hysterectomy 12/2016, ovaries present. Dr. Gertie Fey, upcoming appt 09/2017.  Vision:up to date, use of corrective lens.  Dental:up to date. Done every 1months.  Advanced Directive: Advanced Directives 01/12/2017  Does Patient Have a Medical Advance Directive? No  Would patient like information on creating a medical advance directive? No - Patient declined    Medications and allergies reviewed with patient and updated if appropriate.  Patient Active Problem List   Diagnosis Date Noted  . H/O sickle cell trait 08/25/2017  . Adjustment disorder with mixed anxiety and depressed mood 08/25/2017  . Allergic conjunctivitis and rhinitis, bilateral 08/25/2017  . Menorrhagia 01/12/2017  . Eustachian tube dysfunction, left 12/23/2016  . Left otitis media 11/13/2016  . Allergic rhinitis 11/13/2016  . Tinnitus aurium, left 11/13/2016  . Headache 11/13/2016  . Routine adult health maintenance 08/08/2016  . Class 2 obesity without serious comorbidity with body mass index (BMI) of 36.0 to 36.9 in adult 08/08/2016  . Tobacco use 08/08/2016  . Vitamin D deficiency 08/08/2016  . Acute upper respiratory infection 11/16/2014  . Fatigue 03/13/2014  . Dizziness 03/13/2014  .  Depressed mood 03/13/2014  . Breast lump on right side at 2 o'clock position 03/15/2013    No current outpatient medications on file prior to visit.   No current facility-administered medications on file prior to visit.     Past Medical History:  Diagnosis Date  . Anginal pain ([place])    within the last 3 months; occurs center of the chest   . Bradycardia    hx of syncope in association ; per patient "i havent fell out in like a year but sometimes i feel like i am about to"   . Menorrhagia   . Migraines      Past Surgical History:  Procedure Laterality Date  . BREAST BIOPSY Right 2015  . LAPAROSCOPIC VAGINAL HYSTERECTOMY WITH SALPINGECTOMY Bilateral 01/12/2017   Procedure: LAPAROSCOPIC ASSISTED VAGINAL HYSTERECTOMY WITH bilateral SALPINGECTOMY and ablation of endometriosis;  Surgeon: Everlene Farrier, MD;  [facility];  Service: Gynecology;  Laterality: Bilateral;  . TUBAL LIGATION      Social History   Socioeconomic History  . Marital status: Single    Spouse name: Not on file  . Number of children: 2  . Years of education: 33  . Highest education level: Not on file  Occupational History  . Occupation: Engineer, building services: [employer]  . Financial resource strain: Not on file  . Food insecurity:    Worry: Not on file    Inability: Not on file  . Transportation needs:    Medical: Not on file    Non-medical: Not on file  Tobacco Use  . Smoking status: Current Every Day Smoker    Packs/day: 0.25    Years: 9.00    Pack years: 2.25    Types: Cigarettes  . Smokeless tobacco: Never Used  . Tobacco comment: about 7 cigarettes  a day   Substance and Sexual Activity  . Alcohol use: Yes    Comment: Occasional  . Drug use: No  . Sexual activity: Yes    Birth control/protection: Surgical    Comment: Tuboligation  Lifestyle  . Physical activity:    Days per week: Not on file    Minutes per session: Not on file  . Stress: Not on file  Relationships  . Social connections:    Talks on phone: Not on file    Gets together: Not on file    Attends religious service: Not on file    Active member of club or organization: Not on file    Attends meetings of clubs or organizations: Not on file    Relationship status: Not on file  Other Topics Concern  . Not on file  Social History Narrative   Born and Raised in [place].   Lives with herself, their father and her children. Lives in a townhouse.   11 girls  and 4 girls    Fun:  Works all the time    Family History  Problem Relation Age of Onset  . Hypertension Mother   . Lung cancer Father 42       melanoma lung cancer  . Heart failure Father   . Lung cancer Brother   . Cervical cancer Paternal Grandmother 57  . Sickle cell anemia Paternal Uncle         Review of Systems  Constitutional: Negative for fever, malaise/fatigue and weight loss.  HENT: Positive for congestion. Negative for sore throat.   Eyes: Positive for discharge and redness.  Negative for visual changes  Respiratory: Negative for cough and shortness of breath.   Cardiovascular: Negative for chest pain, palpitations and leg swelling.  Gastrointestinal: Negative for blood in stool, constipation, diarrhea and heartburn.  Genitourinary: Negative for dysuria, frequency and urgency.  Musculoskeletal: Negative for falls, joint pain and myalgias.  Skin: Negative for rash.  Neurological: Negative for dizziness, sensory change and headaches.  Endo/Heme/Allergies: Does not bruise/bleed easily.  Psychiatric/Behavioral: Positive for depression. Negative for memory loss, substance abuse and suicidal ideas. The patient is nervous/anxious. The patient does not have insomnia.     Objective:   Vitals:   08/25/17 1312  BP: 110/72  Pulse: 76  Temp: 99.3 F (37.4 C)  SpO2: 96%    Body mass index is 36.03 kg/m.   Physical Examination:  Physical Exam  Constitutional: She is oriented to person, place, and time and well-developed, well-nourished, and in no distress. No distress.  HENT:  Right Ear: External ear normal.  Left Ear: External ear normal.  Nose: Nose normal.  Mouth/Throat: Oropharynx is clear and moist. No oropharyngeal exudate.  Eyes: Pupils are equal, round, and reactive to light. Conjunctivae and EOM are normal. Right eye exhibits discharge. Right eye exhibits no chemosis, no exudate and no hordeolum. Left eye exhibits discharge. Left eye exhibits no chemosis, no exudate and  no hordeolum. Right conjunctiva is not injected. Right conjunctiva has no hemorrhage. Left conjunctiva is not injected. Left conjunctiva has no hemorrhage. No scleral icterus.  Increased tearing (bilateral)  Neck: Normal range of motion. Neck supple. No thyromegaly present.  Cardiovascular: Normal rate, normal heart sounds and intact distal pulses.  Pulmonary/Chest: Effort normal and breath sounds normal. She exhibits no tenderness.  Abdominal: Soft. Bowel sounds are normal. She exhibits no distension. There is no tenderness.  Genitourinary:  Genitourinary Comments: Breast and pelvic exam deferred to GYN  Musculoskeletal: Normal range of motion. She exhibits edema. She exhibits no tenderness.  Bilateral ankle edema  Lymphadenopathy:    She has no cervical adenopathy.  Neurological: She is alert and oriented to person, place, and time. Gait normal.  Skin: Skin is warm and dry.  Psychiatric: Affect and judgment normal.  Vitals reviewed.   ASSESSMENT and PLAN:  Mary Harrington was seen today for establish care.  Diagnoses and all orders for this visit:  Encounter for preventative adult health care exam with abnormal findings -     CBC; Future -     Comprehensive metabolic panel; Future -     Lipid panel; Future  Vitamin D deficiency -     Vitamin D 1,25 dihydroxy; Future  Class 2 obesity without serious comorbidity with body mass index (BMI) of 36.0 to 36.9 in adult, unspecified obesity type -     Hemoglobin A1c; Future -     TSH; Future  Fatigue, unspecified type -     Hemoglobin A1c; Future -     TSH; Future  Adjustment disorder with mixed anxiety and depressed mood  Allergic conjunctivitis and rhinitis, bilateral -     Olopatadine HCl 0.2 % SOLN; Apply 1 drop to eye daily. -     triamcinolone (NASACORT) 55 MCG/ACT AERO nasal inhaler; Place 2 sprays into the nose daily. -     cetirizine (ZYRTEC) 10 MG tablet; Take 1 tablet (10 mg total) by mouth daily.  Encounter for lipid  screening for cardiovascular disease -     Lipid panel; Future  Hyperglycemia -     Hemoglobin A1c; Future    No  problem-specific Assessment & Plan notes found for this encounter.      Follow up: Return in about 3 months (around 11/24/2017) for weight loss .  Wilfred Lacy, NP

## 2017-08-25 NOTE — Patient Instructions (Signed)
Got to [facility] for blood draw.  DASH Eating Plan DASH stands for "Dietary Approaches to Stop Hypertension." The DASH eating plan is a healthy eating plan that has been shown to reduce high blood pressure (hypertension). It may also reduce your risk for type 2 diabetes, heart disease, and stroke. The DASH eating plan may also help with weight loss. What are tips for following this plan? General guidelines  Avoid eating more than 2,300 mg (milligrams) of salt (sodium) a day. If you have hypertension, you may need to reduce your sodium intake to 1,500 mg a day.  Limit alcohol intake to no more than 1 drink a day for nonpregnant women and 2 drinks a day for men. One drink equals 12 oz of beer, 5 oz of wine, or 1 oz of hard liquor.  Work with your health care provider to maintain a healthy body weight or to lose weight. Ask what an ideal weight is for you.  Get at least 30 minutes of exercise that causes your heart to beat faster (aerobic exercise) most days of the week. Activities may include walking, swimming, or biking.  Work with your health care provider or diet and nutrition specialist (dietitian) to adjust your eating plan to your individual calorie needs. Reading food labels  Check food labels for the amount of sodium per serving. Choose foods with less than 5 percent of the Daily Value of sodium. Generally, foods with less than 300 mg of sodium per serving fit into this eating plan.  To find whole grains, look for the word "whole" as the first word in the ingredient list. Shopping  Buy products labeled as "low-sodium" or "no salt added."  Buy fresh foods. Avoid canned foods and premade or frozen meals. Cooking  Avoid adding salt when cooking. Use salt-free seasonings or herbs instead of table salt or sea salt. Check with your health care provider or pharmacist before using salt substitutes.  Do not fry foods. Cook foods using healthy methods such as baking, boiling, grilling, and  broiling instead.  Cook with heart-healthy oils, such as olive, canola, soybean, or sunflower oil. Meal planning   Eat a balanced diet that includes: ? 5 or more servings of fruits and vegetables each day. At each meal, try to fill half of your plate with fruits and vegetables. ? Up to 6-8 servings of whole grains each day. ? Less than 6 oz of lean meat, poultry, or fish each day. A 3-oz serving of meat is about the same size as a deck of cards. One egg equals 1 oz. ? 2 servings of low-fat dairy each day. ? A serving of nuts, seeds, or beans 5 times each week. ? Heart-healthy fats. Healthy fats called Omega-3 fatty acids are found in foods such as flaxseeds and coldwater fish, like sardines, salmon, and mackerel.  Limit how much you eat of the following: ? Canned or prepackaged foods. ? Food that is high in trans fat, such as fried foods. ? Food that is high in saturated fat, such as fatty meat. ? Sweets, desserts, sugary drinks, and other foods with added sugar. ? Full-fat dairy products.  Do not salt foods before eating.  Try to eat at least 2 vegetarian meals each week.  Eat more home-cooked food and less restaurant, buffet, and fast food.  When eating at a restaurant, ask that your food be prepared with less salt or no salt, if possible. What foods are recommended? The items listed may not be a complete  list. Talk with your dietitian about what dietary choices are best for you. Grains Whole-grain or whole-wheat bread. Whole-grain or whole-wheat pasta. Brown rice. [place]. Bulgur. Whole-grain and low-sodium cereals. Pita bread. Low-fat, low-sodium crackers. Whole-wheat flour tortillas. Vegetables Fresh or frozen vegetables (raw, steamed, roasted, or grilled). Low-sodium or reduced-sodium tomato and vegetable juice. Low-sodium or reduced-sodium tomato sauce and tomato paste. Low-sodium or reduced-sodium canned vegetables. Fruits All fresh, dried, or frozen fruit. Canned  fruit in natural juice (without added sugar). Meat and other protein foods Skinless chicken or [place]. Ground chicken or [place]. Pork with fat trimmed off. Fish and seafood. Egg whites. Dried beans, peas, or lentils. Unsalted nuts, nut butters, and seeds. Unsalted canned beans. Lean cuts of beef with fat trimmed off. Low-sodium, lean deli meat. Dairy Low-fat (1%) or fat-free (skim) milk. Fat-free, low-fat, or reduced-fat cheeses. Nonfat, low-sodium ricotta or cottage cheese. Low-fat or nonfat yogurt. Low-fat, low-sodium cheese. Fats and oils Soft margarine without trans fats. Vegetable oil. Low-fat, reduced-fat, or light mayonnaise and salad dressings (reduced-sodium). Canola, safflower, olive, soybean, and sunflower oils. Avocado. Seasoning and other foods Herbs. Spices. Seasoning mixes without salt. Unsalted popcorn and pretzels. Fat-free sweets. What foods are not recommended? The items listed may not be a complete list. Talk with your dietitian about what dietary choices are best for you. Grains Baked goods made with fat, such as croissants, muffins, or some breads. Dry pasta or rice meal packs. Vegetables Creamed or fried vegetables. Vegetables in a cheese sauce. Regular canned vegetables (not low-sodium or reduced-sodium). Regular canned tomato sauce and paste (not low-sodium or reduced-sodium). Regular tomato and vegetable juice (not low-sodium or reduced-sodium). Angie Fava. Olives. Fruits Canned fruit in a light or heavy syrup. Fried fruit. Fruit in cream or butter sauce. Meat and other protein foods Fatty cuts of meat. Ribs. Fried meat. Berniece Salines. Sausage. Bologna and other processed lunch meats. Salami. Fatback. Hotdogs. Bratwurst. Salted nuts and seeds. Canned beans with added salt. Canned or smoked fish. Whole eggs or egg yolks. Chicken or [place] with skin. Dairy Whole or 2% milk, cream, and half-and-half. Whole or full-fat cream cheese. Whole-fat or sweetened yogurt. Full-fat cheese.  Nondairy creamers. Whipped toppings. Processed cheese and cheese spreads. Fats and oils Butter. Stick margarine. Lard. Shortening. Ghee. Bacon fat. Tropical oils, such as coconut, palm kernel, or palm oil. Seasoning and other foods Salted popcorn and pretzels. Onion salt, garlic salt, seasoned salt, table salt, and sea salt. Worcestershire sauce. Tartar sauce. Barbecue sauce. Teriyaki sauce. Soy sauce, including reduced-sodium. Steak sauce. Canned and packaged gravies. Fish sauce. Oyster sauce. Cocktail sauce. Horseradish that you find on the shelf. Ketchup. Mustard. Meat flavorings and tenderizers. Bouillon cubes. Hot sauce and Tabasco sauce. Premade or packaged marinades. Premade or packaged taco seasonings. Relishes. Regular salad dressings. Where to find more information:  National Heart, Lung, and [place]: https://wilson-eaton.com/  American Heart Association: www.heart.org Summary  The DASH eating plan is a healthy eating plan that has been shown to reduce high blood pressure (hypertension). It may also reduce your risk for type 2 diabetes, heart disease, and stroke.  With the DASH eating plan, you should limit salt (sodium) intake to 2,300 mg a day. If you have hypertension, you may need to reduce your sodium intake to 1,500 mg a day.  When on the DASH eating plan, aim to eat more fresh fruits and vegetables, whole grains, lean proteins, low-fat dairy, and heart-healthy fats.  Work with your health care provider or diet and nutrition specialist (dietitian) to  adjust your eating plan to your individual calorie needs. This information is not intended to replace advice given to you by your health care provider. Make sure you discuss any questions you have with your health care provider. Document Released: 05/01/2011 Document Revised: 05/05/2016 Document Reviewed: 05/05/2016 Elsevier Interactive Patient Education  [employer].

## 2017-08-26 ENCOUNTER — Encounter: Payer: Self-pay | Admitting: Nurse Practitioner

## 2017-09-03 ENCOUNTER — Other Ambulatory Visit: Payer: Self-pay | Admitting: Nurse Practitioner

## 2017-09-03 DIAGNOSIS — Z6836 Body mass index (BMI) 36.0-36.9, adult: Secondary | ICD-10-CM | POA: Diagnosis not present

## 2017-09-03 DIAGNOSIS — E559 Vitamin D deficiency, unspecified: Secondary | ICD-10-CM | POA: Diagnosis not present

## 2017-09-03 DIAGNOSIS — E669 Obesity, unspecified: Secondary | ICD-10-CM | POA: Diagnosis not present

## 2017-09-03 DIAGNOSIS — R5383 Other fatigue: Secondary | ICD-10-CM | POA: Diagnosis not present

## 2017-09-03 DIAGNOSIS — Z136 Encounter for screening for cardiovascular disorders: Secondary | ICD-10-CM | POA: Diagnosis not present

## 2017-09-08 LAB — HEMOGLOBIN A1C
Est. average glucose Bld gHb Est-mCnc: 114 mg/dL
HEMOGLOBIN A1C: 5.6 % (ref 4.8–5.6)

## 2017-09-08 LAB — CBC
HEMATOCRIT: 40.6 % (ref 34.0–46.6)
Hemoglobin: 13.8 g/dL (ref 11.1–15.9)
MCH: 27.9 pg (ref 26.6–33.0)
MCHC: 34 g/dL (ref 31.5–35.7)
MCV: 82 fL (ref 79–97)
Platelets: 278 10*3/uL (ref 150–379)
RBC: 4.94 x10E6/uL (ref 3.77–5.28)
RDW: 14.3 % (ref 12.3–15.4)
WBC: 4.6 10*3/uL (ref 3.4–10.8)

## 2017-09-08 LAB — LIPID PANEL
CHOL/HDL RATIO: 3.5 ratio (ref 0.0–4.4)
Cholesterol, Total: 176 mg/dL (ref 100–199)
HDL: 50 mg/dL (ref >39)
LDL CALC: 114 mg/dL — AB (ref 0–99)
TRIGLYCERIDES: 61 mg/dL (ref 0–149)
VLDL Cholesterol Cal: 12 mg/dL (ref 5–40)

## 2017-09-08 LAB — COMPREHENSIVE METABOLIC PANEL
A/G RATIO: 1.6 (ref 1.2–2.2)
ALBUMIN: 4.1 g/dL (ref 3.5–5.5)
ALT: 10 IU/L (ref 0–32)
AST: 13 IU/L (ref 0–40)
Alkaline Phosphatase: 52 IU/L (ref 39–117)
BILIRUBIN TOTAL: 0.4 mg/dL (ref 0.0–1.2)
BUN / CREAT RATIO: 12 (ref 9–23)
BUN: 11 mg/dL (ref 6–20)
CALCIUM: 9.5 mg/dL (ref 8.7–10.2)
CO2: 23 mmol/L (ref 20–29)
Chloride: 104 mmol/L (ref 96–106)
Creatinine, Ser: 0.91 mg/dL (ref 0.57–1.00)
GFR, EST AFRICAN AMERICAN: 97 mL/min/{1.73_m2} (ref >59)
GFR, EST NON AFRICAN AMERICAN: 84 mL/min/{1.73_m2} (ref >59)
Globulin, Total: 2.6 g/dL (ref 1.5–4.5)
Glucose: 98 mg/dL (ref 65–99)
POTASSIUM: 3.9 mmol/L (ref 3.5–5.2)
Sodium: 140 mmol/L (ref 134–144)
TOTAL PROTEIN: 6.7 g/dL (ref 6.0–8.5)

## 2017-09-08 LAB — VITAMIN D 1,25 DIHYDROXY
VITAMIN D 1, 25 (OH) TOTAL: 21 pg/mL
VITAMIN D3 1, 25 (OH): 20 pg/mL

## 2017-09-08 LAB — TSH: TSH: 1.36 u[IU]/mL (ref 0.450–4.500)

## 2017-12-23 ENCOUNTER — Ambulatory Visit: Payer: BLUE CROSS/BLUE SHIELD | Admitting: Nurse Practitioner

## 2017-12-23 DIAGNOSIS — Z6837 Body mass index (BMI) 37.0-37.9, adult: Secondary | ICD-10-CM | POA: Diagnosis not present

## 2017-12-23 DIAGNOSIS — Z01419 Encounter for gynecological examination (general) (routine) without abnormal findings: Secondary | ICD-10-CM | POA: Diagnosis not present

## 2017-12-23 DIAGNOSIS — Z113 Encounter for screening for infections with a predominantly sexual mode of transmission: Secondary | ICD-10-CM | POA: Diagnosis not present

## 2018-01-18 ENCOUNTER — Ambulatory Visit: Payer: BLUE CROSS/BLUE SHIELD | Admitting: Nurse Practitioner

## 2018-01-18 ENCOUNTER — Encounter: Payer: Self-pay | Admitting: Nurse Practitioner

## 2018-01-18 VITALS — BP 102/66 | HR 82 | Temp 99.3°F | Ht 65.25 in | Wt 218.6 lb

## 2018-01-18 DIAGNOSIS — Z6836 Body mass index (BMI) 36.0-36.9, adult: Secondary | ICD-10-CM

## 2018-01-18 DIAGNOSIS — E6609 Other obesity due to excess calories: Secondary | ICD-10-CM | POA: Diagnosis not present

## 2018-01-18 DIAGNOSIS — J0100 Acute maxillary sinusitis, unspecified: Secondary | ICD-10-CM

## 2018-01-18 MED ORDER — GUAIFENESIN ER 600 MG PO TB12: 600.0000 mg | ORAL_TABLET | Freq: Two times a day (BID) | ORAL | 0 refills | Status: DC | PRN

## 2018-01-18 MED ORDER — SALINE SPRAY 0.65 % NA SOLN: 1.0000 | NASAL | 0 refills | Status: DC | PRN

## 2018-01-18 MED ORDER — AZITHROMYCIN 250 MG PO TABS: 250.0000 mg | ORAL_TABLET | Freq: Every day | ORAL | 0 refills | Status: DC

## 2018-01-18 MED ORDER — METHYLPREDNISOLONE 4 MG PO TBPK: ORAL_TABLET | ORAL | 0 refills | Status: DC

## 2018-01-18 NOTE — Progress Notes (Signed)
Subjective:  Patient ID: Mary Harrington, female    DOB: 1985-06-13  Age: 32 y.o. MRN: [id number]  CC: Follow-up (weight loss consult, patient join the GYM,walking 25min-1hr 3 times a week. not on any diet. she stop smoking. FYI: last pap was 11/2017.   ) and Headache (patient is complaining of headache and dizzieness for 2 wks. patient is taking claritin and benadryl to help. )  HPI  Obesity: BMI 36.1 Diet: low fat Exercise: 3x/week (44mins-1hr) She does not want to use any appetite suppressant at this time. Wt Readings from Last 3 Encounters:  01/18/18 218 lb 9.6 oz (99.2 kg)  08/25/17 218 lb 3.2 oz (99 kg)  01/12/17 203 lb 8 oz (92.3 kg)   Discontinue tobacco use 2weeks ago (12/26/2017). Wean over 6month, then stopped. She is at home with sister who smokes at home, hence exposed to second-hand smoke.  Headache and dizziness x 2weeks. Some sinus congestion, no improvement with flonase and benadryl. Hx of migraine headache (unilateral, nausea, noise and light sensitivity), use of imitrex in past with significant improvement. Triggers: bright light, cigarette smoke, high sodium diet. No change in vision.  Reviewed past Medical, Social and Family history today.  Outpatient Medications Prior to Visit  Medication Sig Dispense Refill  . cetirizine (ZYRTEC) 10 MG tablet Take 1 tablet (10 mg total) by mouth daily. 30 tablet 11  . Olopatadine HCl 0.2 % SOLN Apply 1 drop to eye daily. 2.5 mL 0  . triamcinolone (NASACORT) 55 MCG/ACT AERO nasal inhaler Place 2 sprays into the nose daily. 1 Inhaler 12   No facility-administered medications prior to visit.     ROS See HPI  Objective:  BP 102/66   Pulse 82   Temp 99.3 F (37.4 C) (Oral)   Ht 5' 5.25" (1.657 m)   Wt 218 lb 9.6 oz (99.2 kg)   LMP 01/01/2017 (Exact Date)   SpO2 99%   BMI 36.10 kg/m   BP Readings from Last 3 Encounters:  01/18/18 102/66  08/25/17 110/72  01/13/17 102/61    Wt Readings from Last 3  Encounters:  01/18/18 218 lb 9.6 oz (99.2 kg)  08/25/17 218 lb 3.2 oz (99 kg)  01/12/17 203 lb 8 oz (92.3 kg)   Physical Exam  Constitutional: She is oriented to person, place, and time.  HENT:  Nose: Mucosal edema and rhinorrhea present. Right sinus exhibits maxillary sinus tenderness. Left sinus exhibits maxillary sinus tenderness.  Mouth/Throat: Posterior oropharyngeal erythema present. No oropharyngeal exudate.  Cardiovascular: Normal rate and regular rhythm.  Pulmonary/Chest: Effort normal and breath sounds normal.  Neurological: She is alert and oriented to person, place, and time.  Vitals reviewed.  Lab Results  Component Value Date   WBC 4.6 09/03/2017   HGB 13.8 09/03/2017   HCT 40.6 09/03/2017   PLT 278 09/03/2017   GLUCOSE 98 09/03/2017   CHOL 176 09/03/2017   TRIG 61 09/03/2017   HDL 50 09/03/2017   LDLCALC 114 (H) 09/03/2017   ALT 10 09/03/2017   AST 13 09/03/2017   NA 140 09/03/2017   K 3.9 09/03/2017   CL 104 09/03/2017   CREATININE 0.91 09/03/2017   BUN 11 09/03/2017   CO2 23 09/03/2017   TSH 1.360 09/03/2017   HGBA1C 5.6 09/03/2017    Mm Diag Breast Tomo Uni Right  Result Date: 08/25/2017 CLINICAL DATA:  Short-term follow-up for a small asymmetry adjacent to a biopsy clip in the anteromedial right breast.Patient underwent biopsy of a small  ill-defined apparent mass sonographically in April 2015 with pathology revealing benign breast tissue. This was felt to be concordant. Patient was to have short-term follow-up but did not return until September 2018 at which time the small asymmetry adjacent to the biopsy clip was noted. This had no sonographic correlate. EXAM: DIGITAL DIAGNOSTIC UNILATERAL RIGHT MAMMOGRAM WITH CAD AND TOMO COMPARISON:  Previous exam(s). ACR Breast Density Category b: There are scattered areas of fibroglandular density. FINDINGS: The small area of asymmetry adjacent to the medial right breast biopsy clip is unchanged from the prior study.  There are no discrete masses, other areas of asymmetry, areas of architectural distortion or suspicious calcifications. Mammographic images were processed with CAD. IMPRESSION: 1. Small probably benign area of asymmetry adjacent to the medial right breast biopsy clip. This has been stable for 6 months. 2. No new abnormalities. RECOMMENDATION: 1. Diagnostic mammography with possible right breast ultrasound in 6 months. I have discussed the findings and recommendations with the patient. Results were also provided in writing at the conclusion of the visit. If applicable, a reminder letter will be sent to the patient regarding the next appointment. BI-RADS CATEGORY  3: Probably benign. Electronically Signed   By: Lajean Manes M.D.   On: 08/25/2017 08:13    Assessment & Plan:   Mary Harrington was seen today for follow-up and headache.  Diagnoses and all orders for this visit:  Acute non-recurrent maxillary sinusitis -     methylPREDNISolone (MEDROL DOSEPAK) 4 MG TBPK tablet; Take as directed on package -     azithromycin (ZITHROMAX Z-PAK) 250 MG tablet; Take 1 tablet (250 mg total) by mouth daily. Take 2tabs on first day, then 1tab once a day till complete -     sodium chloride (OCEAN) 0.65 % SOLN nasal spray; Place 1 spray into both nostrils as needed for congestion. -     guaiFENesin (MUCINEX) 600 MG 12 hr tablet; Take 1 tablet (600 mg total) by mouth 2 (two) times daily as needed for cough or to loosen phlegm.  Class 2 obesity due to excess calories without serious comorbidity with body mass index (BMI) of 36.0 to 36.9 in adult   I am having Mary Harrington start on methylPREDNISolone, azithromycin, sodium chloride, and guaiFENesin. I am also having her maintain her Olopatadine HCl, triamcinolone, and cetirizine.  Meds ordered this encounter  Medications  . methylPREDNISolone (MEDROL DOSEPAK) 4 MG TBPK tablet    Sig: Take as directed on package    Dispense:  21 tablet    Refill:  0    Order  Specific Question:   Supervising Provider    Answer:   Lucille Passy [[id number]]  . azithromycin (ZITHROMAX Z-PAK) 250 MG tablet    Sig: Take 1 tablet (250 mg total) by mouth daily. Take 2tabs on first day, then 1tab once a day till complete    Dispense:  6 tablet    Refill:  0    Order Specific Question:   Supervising Provider    Answer:   Lucille Passy [[id number]]  . sodium chloride (OCEAN) 0.65 % SOLN nasal spray    Sig: Place 1 spray into both nostrils as needed for congestion.    Dispense:  15 mL    Refill:  0    Order Specific Question:   Supervising Provider    Answer:   Lucille Passy [[id number]]  . guaiFENesin (MUCINEX) 600 MG 12 hr tablet    Sig: Take 1 tablet (600 mg total) by  mouth 2 (two) times daily as needed for cough or to loosen phlegm.    Dispense:  14 tablet    Refill:  0    Order Specific Question:   Supervising Provider    Answer:   Lucille Passy [[id number]]    Follow-up: Return in about 6 months (around 07/21/2018) for weight loss.  Wilfred Lacy, NP

## 2018-01-18 NOTE — Patient Instructions (Addendum)
Hold Flonase while taking medrol dose pack. Maintain adequate oral hydration.  Continue with diet changes and exercise. Contact Dr. Dennard Nip for additional coaching on diet.  Calorie Counting for Weight Loss Calories are units of energy. Your body needs a certain amount of calories from food to keep you going throughout the day. When you eat more calories than your body needs, your body stores the extra calories as fat. When you eat fewer calories than your body needs, your body burns fat to get the energy it needs. Calorie counting means keeping track of how many calories you eat and drink each day. Calorie counting can be helpful if you need to lose weight. If you make sure to eat fewer calories than your body needs, you should lose weight. Ask your health care provider what a healthy weight is for you. For calorie counting to work, you will need to eat the right number of calories in a day in order to lose a healthy amount of weight per week. A dietitian can help you determine how many calories you need in a day and will give you suggestions on how to reach your calorie goal.  A healthy amount of weight to lose per week is usually 1-2 lb (0.5-0.9 kg). This usually means that your daily calorie intake should be reduced by 500-750 calories.  Eating 1,200 - 1,500 calories per day can help most women lose weight.  Eating 1,500 - 1,800 calories per day can help most men lose weight.  What is my plan? My goal is to have __________ calories per day. If I have this many calories per day, I should lose around __________ pounds per week. What do I need to know about calorie counting? In order to meet your daily calorie goal, you will need to:  Find out how many calories are in each food you would like to eat. Try to do this before you eat.  Decide how much of the food you plan to eat.  Write down what you ate and how many calories it had. Doing this is called keeping a food log.  To  successfully lose weight, it is important to balance calorie counting with a healthy lifestyle that includes regular activity. Aim for 150 minutes of moderate exercise (such as walking) or 75 minutes of vigorous exercise (such as running) each week. Where do I find calorie information?  The number of calories in a food can be found on a Nutrition Facts label. If a food does not have a Nutrition Facts label, try to look up the calories online or ask your dietitian for help. Remember that calories are listed per serving. If you choose to have more than one serving of a food, you will have to multiply the calories per serving by the amount of servings you plan to eat. For example, the label on a package of bread might say that a serving size is 1 slice and that there are 90 calories in a serving. If you eat 1 slice, you will have eaten 90 calories. If you eat 2 slices, you will have eaten 180 calories. How do I keep a food log? Immediately after each meal, record the following information in your food log:  What you ate. Don't forget to include toppings, sauces, and other extras on the food.  How much you ate. This can be measured in cups, ounces, or number of items.  How many calories each food and drink had.  The total number of  calories in the meal.  Keep your food log near you, such as in a small notebook in your pocket, or use a mobile app or website. Some programs will calculate calories for you and show you how many calories you have left for the day to meet your goal. What are some calorie counting tips?  Use your calories on foods and drinks that will fill you up and not leave you hungry: ? Some examples of foods that fill you up are nuts and nut butters, vegetables, lean proteins, and high-fiber foods like whole grains. High-fiber foods are foods with more than 5 g fiber per serving. ? Drinks such as sodas, specialty coffee drinks, alcohol, and juices have a lot of calories, yet do not  fill you up.  Eat nutritious foods and avoid empty calories. Empty calories are calories you get from foods or beverages that do not have many vitamins or protein, such as candy, sweets, and soda. It is better to have a nutritious high-calorie food (such as an avocado) than a food with few nutrients (such as a bag of chips).  Know how many calories are in the foods you eat most often. This will help you calculate calorie counts faster.  Pay attention to calories in drinks. Low-calorie drinks include water and unsweetened drinks.  Pay attention to nutrition labels for "low fat" or "fat free" foods. These foods sometimes have the same amount of calories or more calories than the full fat versions. They also often have added sugar, starch, or salt, to make up for flavor that was removed with the fat.  Find a way of tracking calories that works for you. Get creative. Try different apps or programs if writing down calories does not work for you. What are some portion control tips?  Know how many calories are in a serving. This will help you know how many servings of a certain food you can have.  Use a measuring cup to measure serving sizes. You could also try weighing out portions on a kitchen scale. With time, you will be able to estimate serving sizes for some foods.  Take some time to put servings of different foods on your favorite plates, bowls, and cups so you know what a serving looks like.  Try not to eat straight from a bag or box. Doing this can lead to overeating. Put the amount you would like to eat in a cup or on a plate to make sure you are eating the right portion.  Use smaller plates, glasses, and bowls to prevent overeating.  Try not to multitask (for example, watch TV or use your computer) while eating. If it is time to eat, sit down at a table and enjoy your food. This will help you to know when you are full. It will also help you to be aware of what you are eating and how much  you are eating. What are tips for following this plan? Reading food labels  Check the calorie count compared to the serving size. The serving size may be smaller than what you are used to eating.  Check the source of the calories. Make sure the food you are eating is high in vitamins and protein and low in saturated and trans fats. Shopping  Read nutrition labels while you shop. This will help you make healthy decisions before you decide to purchase your food.  Make a grocery list and stick to it. Cooking  Try to cook your favorite foods in  a healthier way. For example, try baking instead of frying.  Use low-fat dairy products. Meal planning  Use more fruits and vegetables. Half of your plate should be fruits and vegetables.  Include lean proteins like poultry and fish. How do I count calories when eating out?  Ask for smaller portion sizes.  Consider sharing an entree and sides instead of getting your own entree.  If you get your own entree, eat only half. Ask for a box at the beginning of your meal and put the rest of your entree in it so you are not tempted to eat it.  If calories are listed on the menu, choose the lower calorie options.  Choose dishes that include vegetables, fruits, whole grains, low-fat dairy products, and lean protein.  Choose items that are boiled, broiled, grilled, or steamed. Stay away from items that are buttered, battered, fried, or served with cream sauce. Items labeled "crispy" are usually fried, unless stated otherwise.  Choose water, low-fat milk, unsweetened iced tea, or other drinks without added sugar. If you want an alcoholic beverage, choose a lower calorie option such as a glass of wine or light beer.  Ask for dressings, sauces, and syrups on the side. These are usually high in calories, so you should limit the amount you eat.  If you want a salad, choose a garden salad and ask for grilled meats. Avoid extra toppings like bacon, cheese,  or fried items. Ask for the dressing on the side, or ask for olive oil and vinegar or lemon to use as dressing.  Estimate how many servings of a food you are given. For example, a serving of cooked rice is  cup or about the size of half a baseball. Knowing serving sizes will help you be aware of how much food you are eating at restaurants. The list below tells you how big or small some common portion sizes are based on everyday objects: ? 1 oz-4 stacked dice. ? 3 oz-1 deck of cards. ? 1 tsp-1 die. ? 1 Tbsp- a ping-pong ball. ? 2 Tbsp-1 ping-pong ball. ?  cup- baseball. ? 1 cup-1 baseball. Summary  Calorie counting means keeping track of how many calories you eat and drink each day. If you eat fewer calories than your body needs, you should lose weight.  A healthy amount of weight to lose per week is usually 1-2 lb (0.5-0.9 kg). This usually means reducing your daily calorie intake by 500-750 calories.  The number of calories in a food can be found on a Nutrition Facts label. If a food does not have a Nutrition Facts label, try to look up the calories online or ask your dietitian for help.  Use your calories on foods and drinks that will fill you up, and not on foods and drinks that will leave you hungry.  Use smaller plates, glasses, and bowls to prevent overeating. This information is not intended to replace advice given to you by your health care provider. Make sure you discuss any questions you have with your health care provider. Document Released: 05/12/2005 Document Revised: 04/11/2016 Document Reviewed: 04/11/2016 Elsevier Interactive Patient Education  [employer].

## 2018-01-19 ENCOUNTER — Telehealth: Payer: Self-pay

## 2018-01-19 ENCOUNTER — Telehealth: Payer: Self-pay | Admitting: Nurse Practitioner

## 2018-01-19 DIAGNOSIS — Z713 Dietary counseling and surveillance: Secondary | ICD-10-CM

## 2018-01-19 DIAGNOSIS — E6609 Other obesity due to excess calories: Secondary | ICD-10-CM

## 2018-01-19 DIAGNOSIS — Z6836 Body mass index (BMI) 36.0-36.9, adult: Secondary | ICD-10-CM

## 2018-01-19 MED ORDER — PHENTERMINE HCL 37.5 MG PO CAPS: 37.5000 mg | ORAL_CAPSULE | ORAL | 0 refills | Status: DC

## 2018-01-19 NOTE — Telephone Encounter (Signed)
Copied from [place] [phone]. Topic: Quick Communication - See Telephone Encounter >> Jan 19, 2018  2:21 PM Sheran Luz wrote: CRM for notification. See Telephone encounter for: 01/19/18.  Pt called stating that Dr. Lorayne Marek offered her a medication to aid with weight loss, Pt states she declined at the appt but thought about it and would like to try phentermine. Pt would like to know if Dr. Lorayne Marek could prescribe this medication to her now. Please advise.   Pharmacy: [facility] [place], [place] AT [phone] (Phone) [phone]

## 2018-01-19 NOTE — Addendum Note (Signed)
Addended by: Wilfred Lacy L on: 01/19/2018 02:59 PM   Modules accepted: Orders

## 2018-01-19 NOTE — Telephone Encounter (Signed)
Please advise 

## 2018-01-19 NOTE — Telephone Encounter (Signed)
Pt is aware. She will call and make an appt 1 mo with [facility].

## 2018-01-19 NOTE — Telephone Encounter (Signed)
Copied from [place] [phone]. Topic: Quick Communication - See Telephone Encounter >> Jan 19, 2018  2:21 PM Sheran Luz wrote: CRM for notification. See Telephone encounter for: 01/19/18.  Pt called stating that Dr. Lorayne Marek offered her a medication to aid with weight loss, Pt states she declined at the appt but thought about it and would like to try phentermine. Pt would like to know if Dr. Lorayne Marek could prescribe this medication to her now. Please advise.   Pharmacy: [facility] [place], [place] AT [address] [phone] (Phone) [phone]

## 2018-03-01 ENCOUNTER — Other Ambulatory Visit: Payer: Self-pay | Admitting: Obstetrics and Gynecology

## 2018-03-01 ENCOUNTER — Ambulatory Visit
Admission: RE | Admit: 2018-03-01 | Discharge: 2018-03-01 | Disposition: A | Discharge status: Home / Self Care | Payer: BLUE CROSS/BLUE SHIELD | Source: Ambulatory Visit | Attending: Obstetrics and Gynecology | Admitting: Obstetrics and Gynecology

## 2018-03-01 DIAGNOSIS — N6489 Other specified disorders of breast: Secondary | ICD-10-CM

## 2018-03-01 DIAGNOSIS — R928 Other abnormal and inconclusive findings on diagnostic imaging of breast: Secondary | ICD-10-CM | POA: Diagnosis not present

## 2018-03-02 ENCOUNTER — Ambulatory Visit: Payer: BLUE CROSS/BLUE SHIELD | Admitting: Nurse Practitioner

## 2018-07-21 ENCOUNTER — Ambulatory Visit: Payer: BLUE CROSS/BLUE SHIELD | Admitting: Nurse Practitioner

## 2018-09-28 ENCOUNTER — Telehealth: Payer: Self-pay | Admitting: Nurse Practitioner

## 2018-09-28 NOTE — Telephone Encounter (Signed)
Called and talked to patient. She states that she will call back to schedule face to face appt with [facility].

## 2018-12-13 ENCOUNTER — Ambulatory Visit: Payer: Self-pay | Admitting: Nurse Practitioner

## 2018-12-13 NOTE — Telephone Encounter (Signed)
Pt reports sinus pressure, headache, left eye area and headache. States has had sinus infections in past. Reports cough, "White thick phlegm" States afebrile. Headache onset last Thursday, sinus issues this past weekend, "After cleaning my bathroom." No known exposure to covid positive individual, no travel.   Aware practice is closed presently, assured [place] would route to practice for consideration of appt.. Advised to go to [facility] if symptoms worsen.Pt verbalizes understanding. CB# [phone]  Answer Assessment - Initial Assessment Questions 1. LOCATION: "Where does it hurt?"     "Sinus area" 2. ONSET: "When did the headache start?" (Minutes, hours or days)      Last Thursday 3. PATTERN: "Does the pain come and go, or has it been constant since it started?"     Constant 4. SEVERITY: "How bad is the pain?" and "What does it keep you from doing?"  (e.g., Scale 1-10; mild, moderate, or severe)   - MILD (1-3): doesn't interfere with normal activities    - MODERATE (4-7): interferes with normal activities or awakens from sleep    - SEVERE (8-10): excruciating pain, unable to do any normal activities        moderate 5. RECURRENT SYMPTOM: "Have you ever had headaches before?" If so, ask: "When was the last time?" and "What happened that time?"      Yes, sinus infection 6. CAUSE: "What do you think is causing the headache?"     Maybe sinus 7. MIGRAINE: "Have you been diagnosed with migraine headaches?" If so, ask: "Is this headache similar?"      no 8. HEAD INJURY: "Has there been any recent injury to the head?"     no 9. OTHER SYMPTOMS: "Do you have any other symptoms?" (fever, stiff neck, eye pain, sore throat, cold symptoms)     Left eye pain, pressure, cough white phlegm, congestion, no fever  Protocols used: HEADACHE-A-AH

## 2018-12-14 ENCOUNTER — Other Ambulatory Visit: Payer: Self-pay

## 2018-12-14 ENCOUNTER — Telehealth: Payer: Self-pay | Admitting: Nurse Practitioner

## 2018-12-14 ENCOUNTER — Telehealth (INDEPENDENT_AMBULATORY_CARE_PROVIDER_SITE_OTHER): Payer: Managed Care, Other (non HMO) | Admitting: Nurse Practitioner

## 2018-12-14 ENCOUNTER — Encounter: Payer: Self-pay | Admitting: Nurse Practitioner

## 2018-12-14 VITALS — BP 105/79 | HR 85 | Temp 97.5°F | Ht 65.25 in | Wt 222.0 lb

## 2018-12-14 DIAGNOSIS — G44029 Chronic cluster headache, not intractable: Secondary | ICD-10-CM | POA: Diagnosis not present

## 2018-12-14 DIAGNOSIS — J329 Chronic sinusitis, unspecified: Secondary | ICD-10-CM | POA: Diagnosis not present

## 2018-12-14 MED ORDER — CHLORPHEN-PE-ACETAMINOPHEN 4-10-325 MG PO TABS: 1.0000 | ORAL_TABLET | Freq: Three times a day (TID) | ORAL | 0 refills | Status: AC

## 2018-12-14 MED ORDER — PREDNISONE 20 MG PO TABS: ORAL_TABLET | ORAL | 0 refills | Status: AC

## 2018-12-14 NOTE — Telephone Encounter (Signed)
Called pt about message sent in:   Appointment Request From: Mary Harrington    With Provider: Wilfred Lacy, NP [[facility]]    Preferred Date Range: 12/14/2018 - 12/14/2018    Preferred Times: Any Time    Reason for visit: Request an Appointment    Comments:  Sinus problem/ headache    Left message

## 2018-12-14 NOTE — Progress Notes (Signed)
Subjective:  Patient ID: Mary Harrington, female    DOB: June 22, 1985  Age: 33 y.o. MRN: [id number]  CC: Sinusitis (pt is c/o of sinus pressure,headache,congestion,cough and wheezing at times/going on about 5 days ago but got worse Saturday while use chemical to clean the house/took cleritin and benadryl otc,nasal spray. )  Sinusitis This is a recurrent problem. The current episode started more than 1 year ago. The problem has been waxing and waning since onset. There has been no fever. Associated symptoms include congestion, coughing, headaches and sinus pressure. Pertinent negatives include no chills, diaphoresis, ear pain, hoarse voice, neck pain, shortness of breath, sneezing, sore throat or swollen glands. Past treatments include acetaminophen, oral decongestants, saline sprays and spray decongestants (azithromycin and cefdinir, flonase, nasocort). The treatment provided mild relief.  use of oral abx every 46months to relief symptoms. CT sinus never done. Evaluated by ENT over 4years ago, but no helpful recommendation per patient. Has been working from home for last 6weeks, no sick contact at home  Reviewed past Medical, Social and Family history today.  Outpatient Medications Prior to Visit  Medication Sig Dispense Refill   cetirizine (ZYRTEC) 10 MG tablet Take 1 tablet (10 mg total) by mouth daily. 30 tablet 11   loratadine (CLARITIN) 10 MG tablet Claritin 10 mg tablet  Take 1 tablet every day by oral route.     Oxymetazoline HCl (AFRIN NASAL SPRAY NA) Place into the nose.     triamcinolone (NASACORT) 55 MCG/ACT AERO nasal inhaler Place 2 sprays into the nose daily. 1 Inhaler 12   azithromycin (ZITHROMAX Z-PAK) 250 MG tablet Take 1 tablet (250 mg total) by mouth daily. Take 2tabs on first day, then 1tab once a day till complete (Patient not taking: Reported on 12/14/2018) 6 tablet 0   guaiFENesin (MUCINEX) 600 MG 12 hr tablet Take 1 tablet (600 mg total) by mouth 2 (two) times daily  as needed for cough or to loosen phlegm. (Patient not taking: Reported on 12/14/2018) 14 tablet 0   methylPREDNISolone (MEDROL DOSEPAK) 4 MG TBPK tablet Take as directed on package (Patient not taking: Reported on 12/14/2018) 21 tablet 0   Olopatadine HCl 0.2 % SOLN Apply 1 drop to eye daily. (Patient not taking: Reported on 12/14/2018) 2.5 mL 0   phentermine 37.5 MG capsule Take 1 capsule (37.5 mg total) by mouth every morning. (Patient not taking: Reported on 12/14/2018) 30 capsule 0   sodium chloride (OCEAN) 0.65 % SOLN nasal spray Place 1 spray into both nostrils as needed for congestion. (Patient not taking: Reported on 12/14/2018) 15 mL 0   No facility-administered medications prior to visit.     ROS See HPI  Objective:  BP 105/79 Comment: pt report   Pulse 85 Comment: pt report   Temp (!) 97.5 F (36.4 C) (Oral) Comment: ptr report   Ht 5' 5.25" (1.657 m)    Wt 222 lb (100.7 kg) Comment: pt reprt   LMP 01/01/2017 (Exact Date)    BMI 36.66 kg/m   BP Readings from Last 3 Encounters:  12/14/18 105/79  01/18/18 102/66  08/25/17 110/72    Wt Readings from Last 3 Encounters:  12/14/18 222 lb (100.7 kg)  01/18/18 218 lb 9.6 oz (99.2 kg)  08/25/17 218 lb 3.2 oz (99 kg)    Physical Exam Eyes:     Extraocular Movements: Extraocular movements intact.     Conjunctiva/sclera: Conjunctivae normal.  Neck:     Musculoskeletal: Normal range of motion and neck  supple.  Cardiovascular:     Rate and Rhythm: Normal rate.  Pulmonary:     Effort: Pulmonary effort is normal.  Neurological:     Mental Status: She is alert and oriented to person, place, and time.  Psychiatric:        Mood and Affect: Mood normal.        Behavior: Behavior normal.        Thought Content: Thought content normal.    Lab Results  Component Value Date   WBC 4.6 09/03/2017   HGB 13.8 09/03/2017   HCT 40.6 09/03/2017   PLT 278 09/03/2017   GLUCOSE 98 09/03/2017   CHOL 176 09/03/2017   TRIG 61 09/03/2017     HDL 50 09/03/2017   LDLCALC 114 (H) 09/03/2017   ALT 10 09/03/2017   AST 13 09/03/2017   NA 140 09/03/2017   K 3.9 09/03/2017   CL 104 09/03/2017   CREATININE 0.91 09/03/2017   BUN 11 09/03/2017   CO2 23 09/03/2017   TSH 1.360 09/03/2017   HGBA1C 5.6 09/03/2017    US Breast Ltd Uni Right Inc Axilla  Result Date: 03/01/2018 CLINICAL DATA:  33 year old patient presents for 1 year follow-up of probably benign asymmetry in the medial right breast. Patient had an ultrasound-guided biopsy in the region of the palpable mass in 2015. Pathology results were benign, with benign breast tissue seen, and therefore follow-up imaging was recommended in 6 months. The patient was unable to return until September 2018, at which time she had her first mammogram. A 5 mm asymmetry was noted in the inner right breast, adjacent to the biopsy clip. This asymmetry has no sonographic correlate, and appeared stable on follow-up mammogram of August 25, 2017. The patient does not palpate a mass in either breast. EXAM: DIGITAL DIAGNOSTIC RIGHT MAMMOGRAM WITH CAD AND TOMO ULTRASOUND RIGHT BREAST COMPARISON:  Previous exam(s). ACR Breast Density Category b: There are scattered areas of fibroglandular density. FINDINGS: Ribbon shaped biopsy clip is again seen in the periareolar medial right breast. The small 5 mm asymmetry previously described in the medial right breast, and most conspicuous in the CC projection, was evaluated with spot compression view today, and is favored to be a small island of fibroglandular tissue. This asymmetry is mammographically stable. No mass, architectural distortion, or suspicious microcalcification is identified in either breast to suggest malignancy. Mammographic images were processed with CAD. On physical exam, the medial periareolar right breast is soft to palpation. No mass or thickening is felt. Targeted ultrasound is performed, showing normal fibroglandular tissue in medial periareolar right  breast. No solid or cystic mass or abnormal shadowing is identified to suggest malignancy. IMPRESSION: Mammographically stable small asymmetry in the medial right breast with imaging features most consistent with an island of fibroglandular tissue. No evidence of malignancy in either breast. RECOMMENDATION: Screening mammogram at age 60 unless there are persistent or intervening clinical concerns. (Code:SM-B-40A) I have discussed the findings and recommendations with the patient. Results were also provided in writing at the conclusion of the visit. If applicable, a reminder letter will be sent to the patient regarding the next appointment. BI-RADS CATEGORY  2: Benign. Electronically Signed   By: Curlene Dolphin M.D.   On: 03/01/2018 12:49   Mm Diag Breast Tomo Bilateral  Result Date: 03/01/2018 CLINICAL DATA:  33 year old patient presents for 1 year follow-up of probably benign asymmetry in the medial right breast. Patient had an ultrasound-guided biopsy in the region of the palpable mass in  2015. Pathology results were benign, with benign breast tissue seen, and therefore follow-up imaging was recommended in 6 months. The patient was unable to return until September 2018, at which time she had her first mammogram. A 5 mm asymmetry was noted in the inner right breast, adjacent to the biopsy clip. This asymmetry has no sonographic correlate, and appeared stable on follow-up mammogram of August 25, 2017. The patient does not palpate a mass in either breast. EXAM: DIGITAL DIAGNOSTIC RIGHT MAMMOGRAM WITH CAD AND TOMO ULTRASOUND RIGHT BREAST COMPARISON:  Previous exam(s). ACR Breast Density Category b: There are scattered areas of fibroglandular density. FINDINGS: Ribbon shaped biopsy clip is again seen in the periareolar medial right breast. The small 5 mm asymmetry previously described in the medial right breast, and most conspicuous in the CC projection, was evaluated with spot compression view today, and is favored  to be a small island of fibroglandular tissue. This asymmetry is mammographically stable. No mass, architectural distortion, or suspicious microcalcification is identified in either breast to suggest malignancy. Mammographic images were processed with CAD. On physical exam, the medial periareolar right breast is soft to palpation. No mass or thickening is felt. Targeted ultrasound is performed, showing normal fibroglandular tissue in medial periareolar right breast. No solid or cystic mass or abnormal shadowing is identified to suggest malignancy. IMPRESSION: Mammographically stable small asymmetry in the medial right breast with imaging features most consistent with an island of fibroglandular tissue. No evidence of malignancy in either breast. RECOMMENDATION: Screening mammogram at age 60 unless there are persistent or intervening clinical concerns. (Code:SM-B-40A) I have discussed the findings and recommendations with the patient. Results were also provided in writing at the conclusion of the visit. If applicable, a reminder letter will be sent to the patient regarding the next appointment. BI-RADS CATEGORY  2: Benign. Electronically Signed   By: Curlene Dolphin M.D.   On: 03/01/2018 12:49    Assessment & Plan:   Misao was seen today for sinusitis.  Diagnoses and all orders for this visit:  Chronic recurrent sinusitis -     Chlorphen-PE-Acetaminophen 4-10-325 MG TABS; Take 1 tablet by mouth every 8 (eight) hours for 3 days. -     predniSONE (DELTASONE) 20 MG tablet; Take 2 tablets (40 mg total) by mouth daily with breakfast for 2 days, THEN 1.5 tablets (30 mg total) daily with breakfast for 2 days, THEN 1 tablet (20 mg total) daily with breakfast for 2 days, THEN 0.5 tablets (10 mg total) daily with breakfast for 2 days. -     CT MAXILLOFACIAL WO CONTRAST; Future  Chronic cluster headache, not intractable -     CT MAXILLOFACIAL WO CONTRAST; Future   I have discontinued Keasha A. Kolden's  Olopatadine HCl, triamcinolone, cetirizine, methylPREDNISolone, azithromycin, sodium chloride, guaiFENesin, phentermine, loratadine, and Oxymetazoline HCl (AFRIN NASAL SPRAY NA). I am also having her start on Chlorphen-PE-Acetaminophen and predniSONE.  Meds ordered this encounter  Medications   Chlorphen-PE-Acetaminophen 4-10-325 MG TABS    Sig: Take 1 tablet by mouth every 8 (eight) hours for 3 days.    Dispense:  9 tablet    Refill:  0    Order Specific Question:   Supervising Provider    Answer:   Lucille Passy [[id number]]   predniSONE (DELTASONE) 20 MG tablet    Sig: Take 2 tablets (40 mg total) by mouth daily with breakfast for 2 days, THEN 1.5 tablets (30 mg total) daily with breakfast for 2 days, THEN 1 tablet (20  mg total) daily with breakfast for 2 days, THEN 0.5 tablets (10 mg total) daily with breakfast for 2 days.    Dispense:  10 tablet    Refill:  0    Order Specific Question:   Supervising Provider    Answer:   Lucille Passy [[id number]]    Problem List Items Addressed This Visit      Other   Headache   Relevant Orders   CT MAXILLOFACIAL WO CONTRAST    Other Visit Diagnoses    Chronic recurrent sinusitis    -  Primary   Relevant Medications   Chlorphen-PE-Acetaminophen 4-10-325 MG TABS   predniSONE (DELTASONE) 20 MG tablet   Other Relevant Orders   CT MAXILLOFACIAL WO CONTRAST      Follow-up: Return if symptoms worsen or fail to improve.  Wilfred Lacy, NP

## 2019-01-04 ENCOUNTER — Other Ambulatory Visit: Payer: Managed Care, Other (non HMO)

## 2019-01-18 ENCOUNTER — Ambulatory Visit
Admission: RE | Admit: 2019-01-18 | Discharge: 2019-01-18 | Disposition: A | Discharge status: Home / Self Care | Payer: Managed Care, Other (non HMO) | Source: Ambulatory Visit | Attending: Nurse Practitioner | Admitting: Nurse Practitioner

## 2019-01-18 ENCOUNTER — Other Ambulatory Visit: Payer: Self-pay

## 2019-01-18 DIAGNOSIS — G44029 Chronic cluster headache, not intractable: Secondary | ICD-10-CM

## 2019-01-18 DIAGNOSIS — J329 Chronic sinusitis, unspecified: Secondary | ICD-10-CM

## 2019-04-29 ENCOUNTER — Other Ambulatory Visit: Payer: Self-pay | Admitting: Nurse Practitioner

## 2019-04-29 ENCOUNTER — Other Ambulatory Visit: Payer: Self-pay

## 2019-04-29 ENCOUNTER — Ambulatory Visit
Admission: RE | Admit: 2019-04-29 | Discharge: 2019-04-29 | Disposition: A | Discharge status: Home / Self Care | Payer: Managed Care, Other (non HMO) | Source: Ambulatory Visit | Attending: Nurse Practitioner | Admitting: Nurse Practitioner

## 2019-04-29 DIAGNOSIS — R0600 Dyspnea, unspecified: Secondary | ICD-10-CM

## 2019-05-02 ENCOUNTER — Other Ambulatory Visit: Payer: Self-pay | Admitting: Nurse Practitioner

## 2019-05-02 ENCOUNTER — Other Ambulatory Visit: Payer: Self-pay

## 2019-05-02 ENCOUNTER — Encounter: Payer: Self-pay | Admitting: Nurse Practitioner

## 2019-05-02 ENCOUNTER — Telehealth (INDEPENDENT_AMBULATORY_CARE_PROVIDER_SITE_OTHER): Payer: Managed Care, Other (non HMO) | Admitting: Nurse Practitioner

## 2019-05-02 VITALS — Ht 65.25 in

## 2019-05-02 DIAGNOSIS — B349 Viral infection, unspecified: Secondary | ICD-10-CM

## 2019-05-02 DIAGNOSIS — R6889 Other general symptoms and signs: Secondary | ICD-10-CM

## 2019-05-02 DIAGNOSIS — J9801 Acute bronchospasm: Secondary | ICD-10-CM | POA: Diagnosis not present

## 2019-05-02 MED ORDER — HYDROCODONE-HOMATROPINE 5-1.5 MG/5ML PO SYRP: 5.0000 mL | ORAL_SOLUTION | Freq: Two times a day (BID) | ORAL | 0 refills | Status: DC | PRN

## 2019-05-02 MED ORDER — CHLORPHEN-PE-ACETAMINOPHEN 4-10-325 MG PO TABS: 1.0000 | ORAL_TABLET | Freq: Two times a day (BID) | ORAL | 0 refills | Status: AC

## 2019-05-02 MED ORDER — ALBUTEROL SULFATE HFA 108 (90 BASE) MCG/ACT IN AERS: 1.0000 | INHALATION_SPRAY | Freq: Four times a day (QID) | RESPIRATORY_TRACT | 0 refills | Status: AC | PRN

## 2019-05-02 MED ORDER — FLUTICASONE PROPIONATE 50 MCG/ACT NA SUSP: 2.0000 | Freq: Every day | NASAL | 0 refills | Status: DC

## 2019-05-02 MED ORDER — GUAIFENESIN ER 600 MG PO TB12: 600.0000 mg | ORAL_TABLET | Freq: Two times a day (BID) | ORAL | 0 refills | Status: DC | PRN

## 2019-05-02 NOTE — Patient Instructions (Addendum)
Take medications as discussed. Let me know about final COVID results. Maintain adequate oral hydration. Monitor temperature daily. Call office if no improvement in 3days.

## 2019-05-02 NOTE — Progress Notes (Signed)
Virtual Visit via Video Note  I connected with Mary Harrington on 05/02/19 at 11:00 AM EST by a video enabled telemedicine application and verified that I am speaking with the correct person using two identifiers.  Location: Patient:Home Provider:Office Participants: patient and ovider I discussed the limitations of evaluation and management by telemedicine and the availability of in person appointments. The patient expressed understanding and agreed to proceed.  CC: pt c/o of coughing thick clear mucus,headache,dizzy,vomitting,back and shoulder painful when raise arm,had fever of 100/1 wk/pending covid test/took aleve,tylenol sinus & cold,claritin otc/FYI--no vital sign to share today.  History of Present Illness: Cough This is a new problem. The current episode started in the past 7 days. The problem has been gradually worsening. The problem occurs constantly. The cough is productive of sputum. Associated symptoms include chills, headaches, myalgias, nasal congestion, postnasal drip, rhinorrhea, a sore throat, shortness of breath and wheezing. Pertinent negatives include no chest pain or fever. The symptoms are aggravated by lying down. She has tried OTC cough suppressant for the symptoms. The treatment provided no relief. Her past medical history is significant for environmental allergies.  CXR completed 04/29/2019 (normal) COVID swab collected 04/29/2019 (pending)   Observations/Objective: Physical Exam  Constitutional: She is oriented to person, place, and time. No distress.  Eyes: Conjunctivae and EOM are normal.  Neck: Normal range of motion. Neck supple.  Pulmonary/Chest: Effort normal.  Neurological: She is alert and oriented to person, place, and time.  Psychiatric: She has a normal mood and affect. Her behavior is normal.  unable to share any vital signs.  Assessment and Plan: Mary Harrington was seen today for cough.  Diagnoses and all orders for this visit:  Flu-like  symptoms -     guaiFENesin (MUCINEX) 600 MG 12 hr tablet; Take 1 tablet (600 mg total) by mouth 2 (two) times daily as needed for cough or to loosen phlegm. -     fluticasone (FLONASE) 50 MCG/ACT nasal spray; Place 2 sprays into both nostrils daily. -     Chlorphen-PE-Acetaminophen 4-10-325 MG TABS; Take 1 tablet by mouth every 12 (twelve) hours for 3 days. -     HYDROcodone-homatropine (HYCODAN) 5-1.5 MG/5ML syrup; Take 5 mLs by mouth every 12 (twelve) hours as needed. -     albuterol (VENTOLIN HFA) 108 (90 Base) MCG/ACT inhaler; Inhale 1 puff into the lungs every 6 (six) hours as needed for wheezing or shortness of breath.  Acute bronchospasm due to viral infection -     guaiFENesin (MUCINEX) 600 MG 12 hr tablet; Take 1 tablet (600 mg total) by mouth 2 (two) times daily as needed for cough or to loosen phlegm. -     fluticasone (FLONASE) 50 MCG/ACT nasal spray; Place 2 sprays into both nostrils daily. -     Chlorphen-PE-Acetaminophen 4-10-325 MG TABS; Take 1 tablet by mouth every 12 (twelve) hours for 3 days. -     HYDROcodone-homatropine (HYCODAN) 5-1.5 MG/5ML syrup; Take 5 mLs by mouth every 12 (twelve) hours as needed. -     albuterol (VENTOLIN HFA) 108 (90 Base) MCG/ACT inhaler; Inhale 1 puff into the lungs every 6 (six) hours as needed for wheezing or shortness of breath.   Follow Up Instructions: See avs   I discussed the assessment and treatment plan with the patient. The patient was provided an opportunity to ask questions and all were answered. The patient agreed with the plan and demonstrated an understanding of the instructions.   The patient was advised to call  back or seek an in-person evaluation if the symptoms worsen or if the condition fails to improve as anticipated.  Wilfred Lacy, NP

## 2019-05-16 ENCOUNTER — Ambulatory Visit: Payer: Self-pay | Admitting: *Deleted

## 2019-05-16 NOTE — Telephone Encounter (Signed)
Patient is calling to report she is having worsening depression. She had a hard week last week at work and she has been very disturbed.Patient states she has been in the bed and she is having a hard time functioning. Call to office- appointment scheduled.  Reason for Disposition . [1] Depression AND [2] worsening (e.g.,sleeping poorly, less able to do activities of daily living)  Answer Assessment - Initial Assessment Questions 1. CONCERN: "What happened that made you call today?"     Patient has been struggling all month- she had trouble with supervisor last week and she has had anxiety  2. DEPRESSION SYMPTOM SCREENING: "How are you feeling overall?" (e.g., decreased energy, increased sleeping or difficulty sleeping, difficulty concentrating, feelings of sadness, guilt, hopelessness, or worthlessness)     Patient has been in the bed- she is not sleeping at night 3. RISK OF HARM - SUICIDAL IDEATION:  "Do you ever have thoughts of hurting or killing yourself?"  (e.g., yes, no, no but preoccupation with thoughts about death)   - INTENT:  "Do you have thoughts of hurting or killing yourself right NOW?" (e.g., yes, no, N/A)   - PLAN: "Do you have a specific plan for how you would do this?" (e.g., gun, knife, overdose, no plan, N/A)     No- no plans 4. RISK OF HARM - HOMICIDAL IDEATION:  "Do you ever have thoughts of hurting or killing someone else?"  (e.g., yes, no, no but preoccupation with thoughts about death)   - INTENT:  "Do you have thoughts of hurting or killing someone right NOW?" (e.g., yes, no, N/A)   - PLAN: "Do you have a specific plan for how you would do this?" (e.g., gun, knife, no plan, N/A)      no 5. FUNCTIONAL IMPAIRMENT: "How have things been going for you overall in your life? Have you had any more difficulties than usual doing your normal daily activities?"  (e.g., better, same, worse; self-care, school, work, interactions)     Work life is problematic, patient lost step father  recently 71. SUPPORT: "Who is with you now?" "Who do you live with?" "Do you have family or friends nearby who you can talk to?"      Mother and sister are with her 71. THERAPIST: "Do you have a counselor or therapist? Name?"     No- never has seen a therapist 8. STRESSORS: "Has there been any new stress or recent changes in your life?"     Hard to deal with everyday life 9. DRUG ABUSE/ALCOHOL: "Do you drink alcohol or use any illegal drugs?"      no 10. OTHER: "Do you have any other health or medical symptoms right now?" (e.g., fever)       COVID type symptoms- fatigue, headache, cough 11. PREGNANCY: "Is there any chance you are pregnant?" "When was your last menstrual period?"       No- BTL  Protocols used: DEPRESSION-A-AH

## 2019-05-17 ENCOUNTER — Encounter: Payer: Self-pay | Admitting: Nurse Practitioner

## 2019-05-17 ENCOUNTER — Telehealth (INDEPENDENT_AMBULATORY_CARE_PROVIDER_SITE_OTHER): Payer: Managed Care, Other (non HMO) | Admitting: Nurse Practitioner

## 2019-05-17 VITALS — Ht 65.0 in

## 2019-05-17 DIAGNOSIS — F332 Major depressive disorder, recurrent severe without psychotic features: Secondary | ICD-10-CM | POA: Diagnosis not present

## 2019-05-17 DIAGNOSIS — Z13220 Encounter for screening for lipoid disorders: Secondary | ICD-10-CM

## 2019-05-17 DIAGNOSIS — J302 Other seasonal allergic rhinitis: Secondary | ICD-10-CM | POA: Diagnosis not present

## 2019-05-17 DIAGNOSIS — F4321 Adjustment disorder with depressed mood: Secondary | ICD-10-CM

## 2019-05-17 DIAGNOSIS — Z136 Encounter for screening for cardiovascular disorders: Secondary | ICD-10-CM

## 2019-05-17 MED ORDER — BUPROPION HCL 75 MG PO TABS: ORAL_TABLET | ORAL | 1 refills | Status: DC

## 2019-05-17 MED ORDER — MONTELUKAST SODIUM 10 MG PO TABS: 10.0000 mg | ORAL_TABLET | Freq: Every day | ORAL | 5 refills | Status: DC

## 2019-05-17 NOTE — Progress Notes (Signed)
Virtual Visit via Video Note  I connected with Mary Harrington on 05/17/19 at  8:30 AM EST by a video enabled telemedicine application and verified that I am speaking with the correct person using two identifiers.  CC: c/o depression x 3 weeks anxiety x 3-4 days pt states that she have loss 15lbs in 3 weeks.   Location: Patient: home Provider: office Participants: patient and provider I discussed the limitations of evaluation and management by telemedicine and the availability of in person appointments. The patient expressed understanding and agreed to proceed.  History of Present Illness: Reports persistent nasal congestion despite use of flonase, zyrtec and decongestant, associated with frontal headache. denies any fever or purulent nasal drainage or dizziness. CT sinus 2020: clear sinuses.  Depression      The patient presents with depression.  This is a recurrent problem.  The current episode started more than 1 year ago.   The onset quality is gradual.   The problem occurs intermittently.  The most recent episode lasted 2 weeks.    The problem has been waxing and waning since onset.  Associated symptoms include decreased concentration, fatigue, helplessness, irritable, decreased interest, appetite change, body aches, myalgias, headaches and sad.  Associated symptoms include no hopelessness, does not have insomnia, no restlessness, no indigestion and no suicidal ideas.     The symptoms are aggravated by social issues, family issues and work stress.  Past treatments include other medications.  Compliance with treatment is poor.  Past compliance problems include medication issues and difficulty with treatment plan.  Risk factors include family history of mental illness.   Past medical history includes anxiety and depression.     Pertinent negatives include no suicide attempts and no head trauma. she does not want to use SSRI due to possible weight gain. Increased stress at work (made mistake  with lab results which led to disciplinary action), recent death of step-father at home 56months ago. Depression screen [facility] 2/9 05/17/2019 08/25/2017 08/25/2017  Decreased Interest 3 2 2   Down, Depressed, Hopeless 2 1 1   PHQ - 2 Score 5 3 3   Altered sleeping 3 3 3   Tired, decreased energy 3 3 3   Change in appetite 3 2 2   Feeling bad or failure about yourself  2 0 0  Trouble concentrating 3 1 1   Moving slowly or fidgety/restless 0 1 1  Suicidal thoughts 0 0 0  PHQ-9 Score 19 13 13    GAD 7 : Generalized Anxiety Score 05/17/2019 08/25/2017  Nervous, Anxious, on Edge 3 1  Control/stop worrying 3 1  Worry too much - different things 3 2  Trouble relaxing 3 2  Restless 1 0  Easily annoyed or irritable 3 3  Afraid - awful might happen 2 0  Total GAD 7 Score 18 9   Reviewed medication and problem list. Reviewed social and family history.  Observations/Objective: Physical Exam  Constitutional: She is oriented to person, place, and time. She is irritable.  Respiratory: Effort normal.  Neurological: She is alert and oriented to person, place, and time.  Psychiatric: Her speech is normal. Judgment normal. Thought content is not paranoid and not delusional. Cognition and memory are normal. She exhibits a depressed mood. She expresses no homicidal and no suicidal ideation. She expresses no suicidal plans and no homicidal plans.  Tearful during video call   Assessment and Plan: Mary Harrington was seen today for depression.  Diagnoses and all orders for this visit:  Severe episode of recurrent major depressive  disorder, without psychotic features ([place]) -     CBC; Future -     TSH; Future -     HTN_1 BMP; Future -     Ambulatory referral to Psychology -     buPROPion (WELLBUTRIN) 75 MG tablet; Take 1 tablet (75 mg total) by mouth every morning for 7 days, THEN 1 tablet (75 mg total) 2 (two) times daily for 21 days.  Seasonal allergic rhinitis, unspecified trigger -     montelukast (SINGULAIR) 10 MG  tablet; Take 1 tablet (10 mg total) by mouth at bedtime.  Encounter for lipid screening for cardiovascular disease -     HTN_4 Lipid panel; Future   Follow Up Instructions: See avs   I discussed the assessment and treatment plan with the patient. The patient was provided an opportunity to ask questions and all were answered. The patient agreed with the plan and demonstrated an understanding of the instructions.   The patient was advised to call back or seek an in-person evaluation if the symptoms worsen or if the condition fails to improve as anticipated.  Wilfred Lacy, NP

## 2019-05-17 NOTE — Patient Instructions (Addendum)
FMLA form will be completed from 05/17/19 to 06/17/19  Start wellbutrin and montelukast. You will be contacted to schedule appt with therapist.  Persistent Depressive Disorder  Persistent depressive disorder (PDD) is a mental health condition. PDD causes symptoms of low-level depression for 2 years or longer. It may also be called long-term (chronic) depression or dysthymia. PDD may include episodes of more severe depression that last for about 2 weeks (major depressive disorder or MDD). PDD can affect the way you think, feel, and sleep. This condition may also affect your relationships. You may be more likely to get sick if you have PDD. Symptoms of PDD occur for most of the day and may include:  Feeling tired (fatigue).  Low energy.  Eating too much or too little.  Sleeping too much or too little.  Feeling restless or agitated.  Feeling hopeless.  Feeling worthless or guilty.  Feeling worried or nervous (anxiety).  Trouble concentrating or making decisions.  Low self-esteem.  A negative way of looking at things (outlook).  Not being able to have fun or feel pleasure.  Avoiding interacting with people.  Getting angry or annoyed easily (irritability).  Acting aggressive or angry. Follow these instructions at home: Activity  Go back to your normal activities as told by your doctor.  Exercise regularly as told by your doctor. General instructions  Take over-the-counter and prescription medicines only as told by your doctor.  Do not drink alcohol. Or, limit how much alcohol you drink to no more than 1 drink a day for nonpregnant women and 2 drinks a day for men. One drink equals 12 oz of beer, 5 oz of wine, or 1 oz of hard liquor. Alcohol can affect any antidepressant medicines you are taking. Talk with your doctor about your alcohol use.  Eat a healthy diet and get plenty of sleep.  Find activities that you enjoy each day.  Consider joining a support group. Your  doctor may be able to suggest a support group.  Keep all follow-up visits as told by your doctor. This is important. Where to find more information [employer] on Mental Illness  www.nami.org [place]. National Institute of Mental Health  https://carter.com/ National Suicide Prevention Lifeline  938 783 4889).  This is free, 24-hour help. Contact a doctor if:  Your symptoms get worse.  You have new symptoms.  You have trouble sleeping or doing your daily activities. Get help right away if:  You self-harm.  You have serious thoughts about hurting yourself or others.  You see, hear, taste, smell, or feel things that are not there (hallucinate). This information is not intended to replace advice given to you by your health care provider. Make sure you discuss any questions you have with your health care provider. Document Released: 04/23/2015 Document Revised: 04/24/2017 Document Reviewed: 01/04/2016 Elsevier Patient Education  2020 [employer].

## 2019-05-23 ENCOUNTER — Other Ambulatory Visit: Payer: Managed Care, Other (non HMO)

## 2019-05-25 ENCOUNTER — Telehealth: Payer: Self-pay | Admitting: Nurse Practitioner

## 2019-05-25 ENCOUNTER — Other Ambulatory Visit: Payer: Self-pay

## 2019-05-25 ENCOUNTER — Other Ambulatory Visit: Payer: Managed Care, Other (non HMO)

## 2019-05-25 DIAGNOSIS — Z136 Encounter for screening for cardiovascular disorders: Secondary | ICD-10-CM

## 2019-05-25 DIAGNOSIS — F332 Major depressive disorder, recurrent severe without psychotic features: Secondary | ICD-10-CM

## 2019-05-25 DIAGNOSIS — Z13220 Encounter for screening for lipoid disorders: Secondary | ICD-10-CM

## 2019-05-25 NOTE — Telephone Encounter (Signed)
Ok to complete FMLA form from 05/17/19 to 06/17/19

## 2019-05-25 NOTE — Telephone Encounter (Signed)
Charlotte please advise, ok to fill this out? Is it concern about last ov 05/17/2019?

## 2019-05-25 NOTE — Telephone Encounter (Signed)
Paper work done, wait for signature.

## 2019-05-25 NOTE — Telephone Encounter (Signed)
Patient came into office and dropped off FMLA forms that needs to be completed. Patient request that once forms are completed please fax forms and inform patient. Forms are in [facility] file in the front office.

## 2019-05-25 NOTE — Telephone Encounter (Signed)
Form faxed, pt is aware.

## 2019-05-26 LAB — CBC
Hematocrit: 40.1 % (ref 34.0–46.6)
Hemoglobin: 13.6 g/dL (ref 11.1–15.9)
MCH: 27.8 pg (ref 26.6–33.0)
MCHC: 33.9 g/dL (ref 31.5–35.7)
MCV: 82 fL (ref 79–97)
Platelets: 255 10*3/uL (ref 150–450)
RBC: 4.9 x10E6/uL (ref 3.77–5.28)
RDW: 13.3 % (ref 11.7–15.4)
WBC: 4.2 10*3/uL (ref 3.4–10.8)

## 2019-05-26 LAB — BASIC METABOLIC PANEL
BUN/Creatinine Ratio: 15 (ref 9–23)
BUN: 10 mg/dL (ref 6–20)
CO2: 22 mmol/L (ref 20–29)
Calcium: 9.4 mg/dL (ref 8.7–10.2)
Chloride: 105 mmol/L (ref 96–106)
Creatinine, Ser: 0.67 mg/dL (ref 0.57–1.00)
GFR calc Af Amer: 133 mL/min/{1.73_m2} (ref >59)
GFR calc non Af Amer: 115 mL/min/{1.73_m2} (ref >59)
Glucose: 100 mg/dL — ABNORMAL HIGH (ref 65–99)
Potassium: 4 mmol/L (ref 3.5–5.2)
Sodium: 140 mmol/L (ref 134–144)

## 2019-05-26 LAB — LIPID PANEL
Chol/HDL Ratio: 4.2 ratio (ref 0.0–4.4)
Cholesterol, Total: 172 mg/dL (ref 100–199)
HDL: 41 mg/dL (ref >39)
LDL Chol Calc (NIH): 117 mg/dL — ABNORMAL HIGH (ref 0–99)
Triglycerides: 74 mg/dL (ref 0–149)
VLDL Cholesterol Cal: 14 mg/dL (ref 5–40)

## 2019-05-26 LAB — TSH: TSH: 1.76 u[IU]/mL (ref 0.450–4.500)

## 2019-06-07 ENCOUNTER — Other Ambulatory Visit: Payer: Self-pay

## 2019-06-07 ENCOUNTER — Encounter: Payer: Self-pay | Admitting: Nurse Practitioner

## 2019-06-07 ENCOUNTER — Telehealth (INDEPENDENT_AMBULATORY_CARE_PROVIDER_SITE_OTHER): Payer: Managed Care, Other (non HMO) | Admitting: Nurse Practitioner

## 2019-06-07 VITALS — Ht 65.0 in

## 2019-06-07 DIAGNOSIS — F4323 Adjustment disorder with mixed anxiety and depressed mood: Secondary | ICD-10-CM | POA: Diagnosis not present

## 2019-06-07 NOTE — Patient Instructions (Addendum)
Maintain appt with therapist. Take wellbutrin 75mg  2tabs in Am. Let me know if that improves sleep quality. I will send 150mg  XL if no adverse effect with 75mg  SR 2tabs in AM.  FMLA form was completed for you to return to work 06/17/2019, so you should not need any chnages. Please check with employer why you are being as ked to return to work sooner.

## 2019-06-07 NOTE — Progress Notes (Signed)
Virtual Visit via Video Note  I connected with Mary Harrington on 06/07/19 at 10:30 AM EST by a video enabled telemedicine application and verified that I am speaking with the correct person using two identifiers.  Location: Patient:home Provider:office Participants: patient and provider  I discussed the limitations of evaluation and management by telemedicine and the availability of in person appointments. The patient expressed understanding and agreed to proceed.  [id number] up on depression and anxiety--feeling better--just abd upset at times/ FMLA date change consult start date 05/16/19 and end date 06/15/2019.   History of Present Illness: Anxiety and Depression: Reports improved mood with Wellbutrin, but altered sleep due to PM dose. Denies any SI/HI. She has appt with therapist 06/10/2019 and 06/13/2019. She needs FMLA form ammended because her employer is requesting for her to return to work on 06/13/2019.   Observations/Objective: Physical Exam  Constitutional: She is oriented to person, place, and time. No distress.  Pulmonary/Chest: Effort normal.  Neurological: She is alert and oriented to person, place, and time.  Psychiatric: She has a normal mood and affect. Her behavior is normal. Thought content normal.    Assessment and Plan: Mary Harrington was seen today for follow-up.  Diagnoses and all orders for this visit:  Adjustment disorder with mixed anxiety and depressed mood   Follow Up Instructions: See avs   I discussed the assessment and treatment plan with the patient. The patient was provided an opportunity to ask questions and all were answered. The patient agreed with the plan and demonstrated an understanding of the instructions.   The patient was advised to call back or seek an in-person evaluation if the symptoms worsen or if the condition fails to improve as anticipated.  Mary Lacy, NP

## 2019-06-08 NOTE — Telephone Encounter (Signed)
Ok to complete form.

## 2019-06-09 ENCOUNTER — Encounter: Payer: Self-pay | Admitting: Nurse Practitioner

## 2019-06-10 ENCOUNTER — Other Ambulatory Visit: Payer: Self-pay | Admitting: Nurse Practitioner

## 2019-06-10 ENCOUNTER — Ambulatory Visit (INDEPENDENT_AMBULATORY_CARE_PROVIDER_SITE_OTHER): Payer: 59 | Admitting: Psychology

## 2019-06-10 DIAGNOSIS — F3289 Other specified depressive episodes: Secondary | ICD-10-CM | POA: Diagnosis not present

## 2019-06-13 ENCOUNTER — Ambulatory Visit (INDEPENDENT_AMBULATORY_CARE_PROVIDER_SITE_OTHER): Payer: 59 | Admitting: Psychology

## 2019-06-13 DIAGNOSIS — F3289 Other specified depressive episodes: Secondary | ICD-10-CM | POA: Diagnosis not present

## 2019-06-28 ENCOUNTER — Ambulatory Visit: Payer: 59 | Admitting: Psychology

## 2019-07-05 ENCOUNTER — Other Ambulatory Visit: Payer: Self-pay

## 2019-07-06 ENCOUNTER — Ambulatory Visit (INDEPENDENT_AMBULATORY_CARE_PROVIDER_SITE_OTHER): Payer: Managed Care, Other (non HMO) | Admitting: Nurse Practitioner

## 2019-07-06 ENCOUNTER — Encounter: Payer: Self-pay | Admitting: Nurse Practitioner

## 2019-07-06 VITALS — BP 118/72 | HR 94 | Temp 97.5°F | Ht <= 58 in | Wt 213.0 lb

## 2019-07-06 DIAGNOSIS — N898 Other specified noninflammatory disorders of vagina: Secondary | ICD-10-CM | POA: Diagnosis not present

## 2019-07-06 DIAGNOSIS — Z113 Encounter for screening for infections with a predominantly sexual mode of transmission: Secondary | ICD-10-CM

## 2019-07-06 DIAGNOSIS — L2082 Flexural eczema: Secondary | ICD-10-CM | POA: Diagnosis not present

## 2019-07-06 DIAGNOSIS — F3341 Major depressive disorder, recurrent, in partial remission: Secondary | ICD-10-CM | POA: Diagnosis not present

## 2019-07-06 MED ORDER — CERAVE SA RENEWING EX CREA: 1.0000 "application " | TOPICAL_CREAM | Freq: Three times a day (TID) | CUTANEOUS | Status: AC

## 2019-07-06 MED ORDER — METRONIDAZOLE 0.75 % VA GEL: 1.0000 | Freq: Every day | VAGINAL | 0 refills | Status: DC

## 2019-07-06 MED ORDER — BUPROPION HCL 75 MG PO TABS: 75.0000 mg | ORAL_TABLET | ORAL | 1 refills | Status: DC

## 2019-07-06 MED ORDER — TRIAMCINOLONE ACETONIDE 0.5 % EX OINT: 1.0000 "application " | TOPICAL_OINTMENT | Freq: Two times a day (BID) | CUTANEOUS | 0 refills | Status: AC

## 2019-07-06 NOTE — Progress Notes (Signed)
Subjective:  Patient ID: Mary Harrington, female    DOB: Mar 10, 1986  Age: 34 y.o. MRN: [id number]  CC: Eczema (Pt said her ph balance is off for a few months but getting worse, an her eczema is flaring)  Rash This is a recurrent problem. The current episode started more than 1 year ago. The problem has been waxing and waning since onset. The affected locations include the left arm, left lower leg, right arm and right lower leg. The rash is characterized by dryness, itchiness and scaling. Pertinent negatives include no fatigue or joint pain. Past treatments include anti-itch cream, antihistamine, moisturizer and topical steroids. The treatment provided mild relief. Her past medical history is significant for allergies, asthma and eczema. There is no history of varicella.  Vaginal Discharge The patient's primary symptoms include a genital odor and vaginal discharge. The patient's pertinent negatives include no genital itching, genital lesions, genital rash, missed menses, pelvic pain or vaginal bleeding. This is a recurrent problem. The current episode started more than 1 month ago. The problem occurs constantly. The problem has been waxing and waning. The patient is experiencing no pain. She is not pregnant. Associated symptoms include rash. Pertinent negatives include no chills, frequency, joint pain, painful intercourse or urgency. The vaginal discharge was milky, thin and malodorous. There has been no bleeding. Nothing aggravates the symptoms. She has tried nothing for the symptoms. She is sexually active. It is unknown whether or not her partner has an STD. She uses hysterectomy for contraception. Her past medical history is significant for vaginosis. There is no history of PID or an STD.   Reviewed past Medical, Social and Family history today.  Outpatient Medications Prior to Visit  Medication Sig Dispense Refill  . albuterol (VENTOLIN HFA) 108 (90 Base) MCG/ACT inhaler Inhale 1 puff into the  lungs every 6 (six) hours as needed for wheezing or shortness of breath. 6.7 g 0  . fluticasone (FLONASE) 50 MCG/ACT nasal spray SHAKE LIQUID AND USE 2 SPRAYS IN EACH NOSTRIL DAILY 16 g 5  . montelukast (SINGULAIR) 10 MG tablet Take 1 tablet (10 mg total) by mouth at bedtime. 30 tablet 5  . buPROPion (WELLBUTRIN) 75 MG tablet Take 1 tablet (75 mg total) by mouth every morning for 7 days, THEN 1 tablet (75 mg total) 2 (two) times daily for 21 days. (Patient not taking: Reported on 07/06/2019) 49 tablet 1   No facility-administered medications prior to visit.    ROS See HPI  Objective:  BP 118/72   Pulse 94   Temp (!) 97.5 F (36.4 C) (Tympanic)   Ht 4' (1.219 m)   Wt 213 lb (96.6 kg)   LMP 01/01/2017 (Exact Date)   SpO2 98%   BMI 65.00 kg/m   BP Readings from Last 3 Encounters:  07/06/19 118/72  12/14/18 105/79  01/18/18 102/66    Wt Readings from Last 3 Encounters:  07/06/19 213 lb (96.6 kg)  12/14/18 222 lb (100.7 kg)  01/18/18 218 lb 9.6 oz (99.2 kg)    Physical Exam Exam conducted with a chaperone present.  Constitutional:      Appearance: She is obese.  Cardiovascular:     Rate and Rhythm: Normal rate.     Pulses: Normal pulses.  Genitourinary:    Vagina: Vaginal discharge present. No erythema, tenderness or bleeding.  Skin:    Findings: Rash present. Rash is scaling.       Neurological:     Mental Status: She is alert  and oriented to person, place, and time.    Lab Results  Component Value Date   WBC 4.2 05/25/2019   HGB 13.6 05/25/2019   HCT 40.1 05/25/2019   PLT 255 05/25/2019   GLUCOSE 100 (H) 05/25/2019   CHOL 172 05/25/2019   TRIG 74 05/25/2019   HDL 41 05/25/2019   LDLCALC 117 (H) 05/25/2019   ALT 10 09/03/2017   AST 13 09/03/2017   NA 140 05/25/2019   K 4.0 05/25/2019   CL 105 05/25/2019   CREATININE 0.67 05/25/2019   BUN 10 05/25/2019   CO2 22 05/25/2019   TSH 1.760 05/25/2019   HGBA1C 5.6 09/03/2017   Assessment & Plan:  This  visit occurred during the SARS-CoV-2 public health emergency.  Safety protocols were in place, including screening questions prior to the visit, additional usage of staff PPE, and extensive cleaning of exam room while observing appropriate contact time as indicated for disinfecting solutions.   Mary Harrington was seen today for eczema.  Diagnoses and all orders for this visit:  Vaginal odor -     Wet prep, genital -     metroNIDAZOLE (METROGEL VAGINAL) 0.75 % vaginal gel; Place 1 Applicatorful vaginally at bedtime.  Recurrent major depressive disorder, in partial remission (HCC) -     buPROPion (WELLBUTRIN) 75 MG tablet; Take 1 tablet (75 mg total) by mouth every morning.  Screen for STD (sexually transmitted disease) -     Wet prep, genital  Flexural eczema -     Emollient (CERAVE SA RENEWING) CREA; Apply 1 application topically 3 (three) times daily. -     triamcinolone ointment (KENALOG) 0.5 %; Apply 1 application topically 2 (two) times daily.   I have changed Jaedah A. Williard's buPROPion. I am also having her start on CeraVe SA Renewing, triamcinolone ointment, and metroNIDAZOLE. Additionally, [name] maintain her albuterol, fluticasone, and montelukast.  Meds ordered this encounter  Medications  . buPROPion (WELLBUTRIN) 75 MG tablet    Sig: Take 1 tablet (75 mg total) by mouth every morning.    Dispense:  90 tablet    Refill:  1    Order Specific Question:   Supervising Provider    Answer:   Lucille Passy [[id number]]  . Emollient (CERAVE SA RENEWING) CREA    Sig: Apply 1 application topically 3 (three) times daily.    Order Specific Question:   Supervising Provider    Answer:   Lucille Passy [[id number]]  . triamcinolone ointment (KENALOG) 0.5 %    Sig: Apply 1 application topically 2 (two) times daily.    Dispense:  100 g    Refill:  0    Order Specific Question:   Supervising Provider    Answer:   Lucille Passy [[id number]]  . metroNIDAZOLE (METROGEL VAGINAL) 0.75 % vaginal gel     Sig: Place 1 Applicatorful vaginally at bedtime.    Dispense:  70 g    Refill:  0    Order Specific Question:   Supervising Provider    Answer:   Lucille Passy [[id number]]    Problem List Items Addressed This Visit    None    Visit Diagnoses    Vaginal odor    -  Primary   Relevant Medications   metroNIDAZOLE (METROGEL VAGINAL) 0.75 % vaginal gel   Other Relevant Orders   Wet prep, genital   Recurrent major depressive disorder, in partial remission (HCC)       Relevant Medications  buPROPion (WELLBUTRIN) 75 MG tablet   Screen for STD (sexually transmitted disease)       Relevant Orders   Wet prep, genital   Flexural eczema       Relevant Medications   Emollient (CERAVE SA RENEWING) CREA   triamcinolone ointment (KENALOG) 0.5 %      Follow-up: Return if symptoms worsen or fail to improve.  Wilfred Lacy, NP

## 2019-07-08 LAB — WET PREP, GENITAL

## 2019-07-11 ENCOUNTER — Telehealth: Payer: Self-pay | Admitting: Nurse Practitioner

## 2019-07-11 NOTE — Telephone Encounter (Signed)
Mary Harrington is calling to let Baldo Ash know that she will be faxing over a document for behavior health to see if the recent office notes supports patients short term disability. CB is [phone]

## 2019-07-12 ENCOUNTER — Ambulatory Visit: Payer: Managed Care, Other (non HMO) | Admitting: Nurse Practitioner

## 2019-07-12 NOTE — Telephone Encounter (Signed)
Spoke with Mary Harrington, advised her that we sent in the first form on 07/04/2019 and I resend it again today. It will take 24 hrs for them be received the fax.

## 2019-08-01 ENCOUNTER — Telehealth: Payer: Self-pay | Admitting: Nurse Practitioner

## 2019-08-01 NOTE — Telephone Encounter (Signed)
Called pt and left vm on 07/27/2019, 07/28/2019 and 08/01/2019 for the to call back, need to in get the pt back in Onset schedule to get wet prep done--we have the special tub for [employer]. Per Baldo Ash pt will not be charge for this since we have to repeat this test.

## 2019-08-22 ENCOUNTER — Other Ambulatory Visit: Payer: Self-pay

## 2019-08-22 ENCOUNTER — Telehealth (INDEPENDENT_AMBULATORY_CARE_PROVIDER_SITE_OTHER): Payer: Medicaid Other | Admitting: Nurse Practitioner

## 2019-08-22 ENCOUNTER — Encounter: Payer: Self-pay | Admitting: Nurse Practitioner

## 2019-08-22 DIAGNOSIS — G44019 Episodic cluster headache, not intractable: Secondary | ICD-10-CM

## 2019-08-22 MED ORDER — RIZATRIPTAN BENZOATE 5 MG PO TABS: 5.0000 mg | ORAL_TABLET | ORAL | 0 refills | Status: DC | PRN

## 2019-08-22 NOTE — Assessment & Plan Note (Signed)
Quit 2019 

## 2019-08-22 NOTE — Patient Instructions (Signed)
Cluster Headache Cluster headaches are deeply painful. They normally occur on one side of your head, but they may switch sides. Often, cluster headaches:  Are severe.  Happen often for a few weeks or months and then go away for a while.  Last from 15 minutes to 3 hours.  Happen at the same time each day.  Happen at night.  Happen many times a day. Follow these instructions at home:        Follow instructions from your doctor to care for yourself at home:  Go to bed at the same time each night. Get the same amount of sleep every night.  Avoid alcohol.  Stop smoking if you smoke. This includes cigarettes and e-cigarettes.  Take over-the-counter and prescription medicines only as told by your doctor.  Do not drive or use heavy machinery while taking prescription pain medicine.  Use oxygen as told by your doctor.  Exercise regularly.  Eat a healthy diet.  Write down when each headache happened, what kind of pain you had, how bad your pain was, and what you tried to help your pain. This is called a headache diary. Use it as told by your doctor. Contact a doctor if:  Your headaches get worse or they happen more often.  Your medicines are not helping. Get help right away if:  You pass out (faint).  You get weak or lose feeling (have numbness) on one side of your body or face.  You see two of everything (double vision).  You feel sick to your stomach (nauseous) or you throw up (vomit), and you do not stop after many hours.  You have trouble with your balance or with walking.  You have trouble talking.  You have neck pain or stiffness.  You have a fever. This information is not intended to replace advice given to you by your health care provider. Make sure you discuss any questions you have with your health care provider. Document Revised: 03/09/2019 Document Reviewed: 01/18/2016 Elsevier Patient Education  2020 Else[employer] Inc[employer]  

## 2019-08-22 NOTE — Progress Notes (Signed)
Virtual Visit via Video Note  I connected with@ on 08/22/19 at  9:00 AM EDT by a video enabled telemedicine application and verified that I am speaking with the correct person using two identifiers.  Location: Patient:Home Provider: Office Participants: patient and provider  I discussed the limitations of evaluation and management by telemedicine and the availability of in person appointments. I also discussed with the patient that there may be a patient responsible charge related to this service. The patient expressed understanding and agreed to proceed.  CC:Pt stated she was sent home Friday her bp was 100/40 and her allergies have been acting up//pt states no cough an no fever but just in nasal cavities//Pt said she was at work and will have vitals at time of her visit  History of Present Illness: Headache  This is a new problem. The current episode started in the past 7 days. The problem occurs daily. The problem has been unchanged. The pain is located in the retro-orbital and left unilateral region. The pain does not radiate. The pain quality is not similar to prior headaches. The quality of the pain is described as aching, dull and throbbing. Associated symptoms include dizziness, eye pain, eye watering, rhinorrhea and sinus pressure. Pertinent negatives include no abdominal pain, abnormal behavior, anorexia, blurred vision, drainage, eye redness, facial sweating, fever, hearing loss, insomnia, loss of balance, nausea, numbness, phonophobia, photophobia, scalp tenderness, seizures, sore throat, swollen glands, tingling, tinnitus, visual change, vomiting, weakness or weight loss. The symptoms are aggravated by weather changes. She has tried nothing for the symptoms. Her past medical history is significant for obesity. There is no history of cancer, hypertension, immunosuppression, migraine headaches, migraines in the family, pseudotumor cerebri, recent head traumas, sinus disease or TMJ.    Observations/Objective: Physical Exam  Constitutional: She is oriented to person, place, and time. No distress.  Eyes: Conjunctivae and EOM are normal.  Cardiovascular: Normal rate.  Pulmonary/Chest: Effort normal.  Musculoskeletal:     Cervical back: Normal range of motion and neck supple.  Neurological: She is alert and oriented to person, place, and time.  Psychiatric: She has a normal mood and affect. Her behavior is normal. Thought content normal.   Assessment and Plan: Yita was seen today for nasal congestion.  Diagnoses and all orders for this visit:  Episodic cluster headache, not intractable -     rizatriptan (MAXALT) 5 MG tablet; Take 1 tablet (5 mg total) by mouth as needed for migraine. May repeat in 2 hours if needed. No more than 4tab in 24hrs   Follow Up Instructions: See avs   I discussed the assessment and treatment plan with the patient. The patient was provided an opportunity to ask questions and all were answered. The patient agreed with the plan and demonstrated an understanding of the instructions.   The patient was advised to call back or seek an in-person evaluation if the symptoms worsen or if the condition fails to improve as anticipated.  Wilfred Lacy, NP

## 2019-09-17 ENCOUNTER — Emergency Department (HOSPITAL_COMMUNITY): Payer: Medicaid Other

## 2019-09-17 ENCOUNTER — Other Ambulatory Visit: Payer: Self-pay

## 2019-09-17 ENCOUNTER — Emergency Department (HOSPITAL_COMMUNITY)
Admission: EM | Admit: 2019-09-17 | Discharge: 2019-09-17 | Disposition: A | Discharge status: Home / Self Care | Payer: Medicaid Other | Attending: Emergency Medicine | Admitting: Emergency Medicine

## 2019-09-17 DIAGNOSIS — R112 Nausea with vomiting, unspecified: Secondary | ICD-10-CM

## 2019-09-17 DIAGNOSIS — R4701 Aphasia: Secondary | ICD-10-CM | POA: Diagnosis not present

## 2019-09-17 DIAGNOSIS — Z87891 Personal history of nicotine dependence: Secondary | ICD-10-CM | POA: Diagnosis not present

## 2019-09-17 DIAGNOSIS — R1084 Generalized abdominal pain: Secondary | ICD-10-CM | POA: Diagnosis not present

## 2019-09-17 DIAGNOSIS — Z79899 Other long term (current) drug therapy: Secondary | ICD-10-CM | POA: Insufficient documentation

## 2019-09-17 DIAGNOSIS — R7401 Elevation of levels of liver transaminase levels: Secondary | ICD-10-CM

## 2019-09-17 DIAGNOSIS — R109 Unspecified abdominal pain: Secondary | ICD-10-CM | POA: Diagnosis present

## 2019-09-17 LAB — CBC WITH DIFFERENTIAL/PLATELET
Abs Immature Granulocytes: 0.02 10*3/uL (ref 0.00–0.07)
Basophils Absolute: 0 10*3/uL (ref 0.0–0.1)
Basophils Relative: 0 %
Eosinophils Absolute: 0 10*3/uL (ref 0.0–0.5)
Eosinophils Relative: 1 %
HCT: 40.3 % (ref 36.0–46.0)
Hemoglobin: 13.2 g/dL (ref 12.0–15.0)
Immature Granulocytes: 0 %
Lymphocytes Relative: 9 %
Lymphs Abs: 0.5 10*3/uL — ABNORMAL LOW (ref 0.7–4.0)
MCH: 27.2 pg (ref 26.0–34.0)
MCHC: 32.8 g/dL (ref 30.0–36.0)
MCV: 83.1 fL (ref 80.0–100.0)
Monocytes Absolute: 0.3 10*3/uL (ref 0.1–1.0)
Monocytes Relative: 6 %
Neutro Abs: 4.5 10*3/uL (ref 1.7–7.7)
Neutrophils Relative %: 84 %
Platelets: 213 10*3/uL (ref 150–400)
RBC: 4.85 MIL/uL (ref 3.87–5.11)
RDW: 13.5 % (ref 11.5–15.5)
WBC: 5.4 10*3/uL (ref 4.0–10.5)
nRBC: 0 % (ref 0.0–0.2)

## 2019-09-17 LAB — URINALYSIS, ROUTINE W REFLEX MICROSCOPIC
Bacteria, UA: NONE SEEN
Glucose, UA: NEGATIVE mg/dL
Hgb urine dipstick: NEGATIVE
Ketones, ur: NEGATIVE mg/dL
Leukocytes,Ua: NEGATIVE
Nitrite: NEGATIVE
Protein, ur: 30 mg/dL — AB
Specific Gravity, Urine: 1.028 (ref 1.005–1.030)
pH: 5 (ref 5.0–8.0)

## 2019-09-17 LAB — LIPASE, BLOOD: Lipase: 20 U/L (ref 11–51)

## 2019-09-17 LAB — COMPREHENSIVE METABOLIC PANEL
ALT: 220 U/L — ABNORMAL HIGH (ref 0–44)
AST: 423 U/L — ABNORMAL HIGH (ref 15–41)
Albumin: 3.8 g/dL (ref 3.5–5.0)
Alkaline Phosphatase: 58 U/L (ref 38–126)
Anion gap: 9 (ref 5–15)
BUN: 11 mg/dL (ref 6–20)
CO2: 23 mmol/L (ref 22–32)
Calcium: 8.8 mg/dL — ABNORMAL LOW (ref 8.9–10.3)
Chloride: 106 mmol/L (ref 98–111)
Creatinine, Ser: 0.84 mg/dL (ref 0.44–1.00)
GFR calc Af Amer: >60 mL/min (ref >60)
GFR calc non Af Amer: >60 mL/min (ref >60)
Glucose, Bld: 110 mg/dL — ABNORMAL HIGH (ref 70–99)
Potassium: 3.3 mmol/L — ABNORMAL LOW (ref 3.5–5.1)
Sodium: 138 mmol/L (ref 135–145)
Total Bilirubin: 0.9 mg/dL (ref 0.3–1.2)
Total Protein: 7 g/dL (ref 6.5–8.1)

## 2019-09-17 LAB — I-STAT BETA HCG BLOOD, ED (MC, WL, AP ONLY): I-stat hCG, quantitative: <5 m[IU]/mL (ref <5)

## 2019-09-17 MED ORDER — ONDANSETRON HCL 4 MG/2ML IJ SOLN
4.0000 mg | Freq: Once | INTRAMUSCULAR | Status: AC
  Administered 2019-09-17: 4 mg via INTRAVENOUS
  Filled 2019-09-17: qty 2

## 2019-09-17 MED ORDER — SODIUM CHLORIDE (PF) 0.9 % IJ SOLN
INTRAMUSCULAR | Status: AC
  Filled 2019-09-17: qty 50

## 2019-09-17 MED ORDER — FENTANYL CITRATE (PF) 100 MCG/2ML IJ SOLN
100.0000 ug | Freq: Once | INTRAMUSCULAR | Status: AC
  Administered 2019-09-17: 100 ug via INTRAVENOUS
  Filled 2019-09-17: qty 2

## 2019-09-17 MED ORDER — IOHEXOL 300 MG/ML  SOLN
100.0000 mL | Freq: Once | INTRAMUSCULAR | Status: AC | PRN
  Administered 2019-09-17: 100 mL via INTRAVENOUS

## 2019-09-17 MED ORDER — IBUPROFEN 800 MG PO TABS: 800.0000 mg | ORAL_TABLET | Freq: Three times a day (TID) | ORAL | 0 refills | Status: DC | PRN

## 2019-09-17 MED ORDER — ONDANSETRON HCL 8 MG PO TABS: 8.0000 mg | ORAL_TABLET | Freq: Three times a day (TID) | ORAL | 0 refills | Status: DC | PRN

## 2019-09-17 NOTE — ED Provider Notes (Signed)
[facility] DEPT Provider Note   CSN: [id number] Arrival date & time: 09/17/19  1649     History Chief Complaint  Patient presents with  . Headache  . Abdominal Pain    Mary Harrington is a 34 y.o. female.  HPI She presents for evaluation of a sensation on a "knot," of her upper abdomen, for several days.  Also, yesterday, she ate a seafood dinner at a restaurant and after that developed vomiting and diarrhea, 1 each.  No blood in either.  She denies fever.  There is been no cough, shortness of breath, weakness or dizziness.  There are no other known modifying factors.    Past Medical History:  Diagnosis Date  . Anginal pain ([place])    within the last 3 months; occurs center of the chest   . Bradycardia    hx of syncope in association ; per patient "i havent fell out in like a year but sometimes i feel like i am about to"   . Menorrhagia   . Migraines     Patient Active Problem List   Diagnosis Date Noted  . Flexural eczema 07/06/2019  . Recurrent major depressive disorder, in partial remission ([place]) 07/06/2019  . Grief 05/17/2019  . H/O sickle cell trait 08/25/2017  . Adjustment disorder with mixed anxiety and depressed mood 08/25/2017  . Menorrhagia 01/12/2017  . Eustachian tube dysfunction, left 12/23/2016  . Left otitis media 11/13/2016  . Allergic rhinitis 11/13/2016  . Tinnitus aurium, left 11/13/2016  . Headache 11/13/2016  . Routine adult health maintenance 08/08/2016  . Class 2 obesity without serious comorbidity with body mass index (BMI) of 36.0 to 36.9 in adult 08/08/2016  . Vitamin D deficiency 08/08/2016  . Fatigue 03/13/2014  . Dizziness 03/13/2014  . Depressed mood 03/13/2014  . Breast lump on right side at 2 o'clock position 03/15/2013    Past Surgical History:  Procedure Laterality Date  . BREAST BIOPSY Right 2015  . LAPAROSCOPIC VAGINAL HYSTERECTOMY WITH SALPINGECTOMY Bilateral 01/12/2017   Procedure: LAPAROSCOPIC  ASSISTED VAGINAL HYSTERECTOMY WITH bilateral SALPINGECTOMY and ablation of endometriosis;  Surgeon: Everlene Farrier, MD;  Location: [facility];  Service: Gynecology;  Laterality: Bilateral;  . TUBAL LIGATION       OB History    Gravida  2   Para  2   Term  2   Preterm      AB      Living  2     SAB      TAB      Ectopic      Multiple      Live Births              Family History  Problem Relation Age of Onset  . Hypertension Mother   . Lung cancer Father 39       melanoma lung cancer  . Heart failure Father   . Lung cancer Brother   . Cervical cancer Paternal Grandmother 54  . Sickle cell anemia Paternal Uncle   . Bipolar disorder Maternal Grandfather   . Schizophrenia Maternal Grandfather   . Bipolar disorder Cousin   . Schizophrenia Cousin     Social History   Tobacco Use  . Smoking status: Former Smoker    Packs/day: 0.25    Years: 9.00    Pack years: 2.25    Types: Cigarettes    Quit date: 06/16/2017    Years since quitting: 2.2  . Smokeless tobacco:  Never Used  . Tobacco comment: about 7 cigarettes  a day   Substance Use Topics  . Alcohol use: Yes    Comment: Occasional  . Drug use: No    Home Medications Prior to Admission medications   Medication Sig Start Date End Date Taking? Authorizing Provider  albuterol (VENTOLIN HFA) 108 (90 Base) MCG/ACT inhaler Inhale 1 puff into the lungs every 6 (six) hours as needed for wheezing or shortness of breath. 05/02/19  Yes Nche, Charlene Brooke, NP  buPROPion (WELLBUTRIN) 75 MG tablet Take 1 tablet (75 mg total) by mouth every morning. 07/06/19  Yes Nche, Charlene Brooke, NP  fluticasone (FLONASE) 50 MCG/ACT nasal spray SHAKE LIQUID AND USE 2 SPRAYS IN EACH NOSTRIL DAILY Patient taking differently: Place 2 sprays into both nostrils daily.  05/02/19  Yes Nche, Charlene Brooke, NP  montelukast (SINGULAIR) 10 MG tablet Take 1 tablet (10 mg total) by mouth at bedtime. 05/17/19  Yes Nche, Charlene Brooke, NP  rizatriptan (MAXALT) 5 MG tablet Take 1 tablet (5 mg total) by mouth as needed for migraine. May repeat in 2 hours if needed. No more than 4tab in 24hrs 08/22/19  Yes Nche, Charlene Brooke, NP  triamcinolone ointment (KENALOG) 0.5 % Apply 1 application topically 2 (two) times daily. 07/06/19  Yes Nche, Charlene Brooke, NP  Emollient (CERAVE SA RENEWING) CREA Apply 1 application topically 3 (three) times daily. Patient not taking: Reported on 09/17/2019 07/06/19   Nche, Charlene Brooke, NP  ibuprofen (ADVIL) 800 MG tablet Take 1 tablet (800 mg total) by mouth every 8 (eight) hours as needed for moderate pain. 09/17/19   Daleen Bo, MD  metroNIDAZOLE (METROGEL VAGINAL) 0.75 % vaginal gel Place 1 Applicatorful vaginally at bedtime. Patient not taking: Reported on 08/22/2019 07/06/19   Nche, Charlene Brooke, NP  ondansetron (ZOFRAN) 8 MG tablet Take 1 tablet (8 mg total) by mouth every 8 (eight) hours as needed for nausea or vomiting. 09/17/19   Daleen Bo, MD    Allergies    Patient has no known allergies.  Review of Systems   Review of Systems  All other systems reviewed and are negative.   Physical Exam Updated Vital Signs BP 122/74   Pulse (!) 57   Temp 98.4 F (36.9 C) (Oral)   Resp 18   Wt 94.8 kg   LMP 01/01/2017 (Exact Date)   SpO2 100%   BMI 63.78 kg/m   Physical Exam Vitals and nursing note reviewed.  Constitutional:      General: She is not in acute distress.    Appearance: She is well-developed. She is obese. She is not ill-appearing, toxic-appearing or diaphoretic.  HENT:     Head: Normocephalic and atraumatic.  Eyes:     Conjunctiva/sclera: Conjunctivae normal.     Pupils: Pupils are equal, round, and reactive to light.  Neck:     Trachea: Phonation normal.  Cardiovascular:     Rate and Rhythm: Normal rate and regular rhythm.  Pulmonary:     Effort: Pulmonary effort is normal.     Breath sounds: Normal breath sounds.  Chest:     Chest wall: No tenderness.    Abdominal:     General: There is no distension.     Palpations: Abdomen is soft. There is no mass.     Tenderness: There is abdominal tenderness (Mild, diffuse). There is no guarding.     Hernia: No hernia is present.  Musculoskeletal:        General: Normal  range of motion.     Cervical back: Normal range of motion and neck supple.  Skin:    General: Skin is warm and dry.  Neurological:     Mental Status: She is alert and oriented to person, place, and time.     Cranial Nerves: No cranial nerve deficit.     Motor: No weakness or abnormal muscle tone.  Psychiatric:        Mood and Affect: Mood normal.        Behavior: Behavior normal.        Thought Content: Thought content normal.        Judgment: Judgment normal.     ED Results / Procedures / Treatments   Labs (all labs ordered are listed, but only abnormal results are displayed) Labs Reviewed  CBC WITH DIFFERENTIAL/PLATELET - Abnormal; Notable for the following components:      Result Value   Lymphs Abs 0.5 (*)    All other components within normal limits  COMPREHENSIVE METABOLIC PANEL - Abnormal; Notable for the following components:   Potassium 3.3 (*)    Glucose, Bld 110 (*)    Calcium 8.8 (*)    AST 423 (*)    ALT 220 (*)    All other components within normal limits  URINALYSIS, ROUTINE W REFLEX MICROSCOPIC - Abnormal; Notable for the following components:   Color, Urine AMBER (*)    Bilirubin Urine SMALL (*)    Protein, ur 30 (*)    All other components within normal limits  LIPASE, BLOOD  HEPATITIS PANEL, ACUTE  I-STAT BETA HCG BLOOD, ED ([facility], [facility], [facility] ONLY)    EKG None  Radiology CT Abdomen Pelvis W Contrast  Result Date: 09/17/2019 CLINICAL DATA:  Abdominal abscess/infection EXAM: CT ABDOMEN AND PELVIS WITH CONTRAST TECHNIQUE: Multidetector CT imaging of the abdomen and pelvis was performed using the standard protocol following bolus administration of intravenous contrast. CONTRAST:  157mL OMNIPAQUE  IOHEXOL 300 MG/ML  SOLN COMPARISON:  None. FINDINGS: Lower chest: The visualized heart size within normal limits. No pericardial fluid/thickening. No hiatal hernia. The visualized portions of the lungs are clear. Hepatobiliary: A tiny hypodensity seen within the inferior right liver lobe.The main portal vein is patent. No evidence of calcified gallstones, gallbladder wall thickening or biliary dilatation. Pancreas: Unremarkable. No pancreatic ductal dilatation or surrounding inflammatory changes. Spleen: Normal in size without focal abnormality. Adrenals/Urinary Tract: Both adrenal glands appear normal. There is a tiny 4 mm hypodense lesion within the upper pole of left kidney. No urinary tract calculus or hydronephrosis. Bladder is unremarkable. Stomach/Bowel: The stomach, small bowel, are normal in appearance. There is question of a focal segment of sigmoid colon with mild wall thickening versus decompression, series 2, image 63. No surrounding fat stranding changes however are noted. No loculated fluid collections or free air. The appendix is unremarkable. Vascular/Lymphatic: There are no enlarged mesenteric, retroperitoneal, or pelvic lymph nodes. No significant vascular findings are present. Reproductive: The patient is status post hysterectomy. No adnexal masses or collections seen. Other: Fat containing anterior umbilical hernia noted. Musculoskeletal: No acute or significant osseous findings. IMPRESSION: Mild wall thickening of a focal segment of sigmoid colon could be due to decompression versus true wall thickening from mild colitis. No significant however surrounding inflammatory changes. Electronically Signed   By: Prudencio Pair M.D.   On: 09/17/2019 20:42    Procedures Procedures (including critical care time)  Medications Ordered in ED Medications  sodium chloride (PF) 0.9 % injection (has no  administration in time range)  iohexol (OMNIPAQUE) 300 MG/ML solution 100 mL (100 mLs Intravenous  Contrast Given 09/17/19 2009)  fentaNYL (SUBLIMAZE) injection 100 mcg (100 mcg Intravenous Given 09/17/19 2048)  ondansetron (ZOFRAN) injection 4 mg (4 mg Intravenous Given 09/17/19 2049)    ED Course  I have reviewed the triage vital signs and the nursing notes.  Pertinent labs & imaging results that were available during my care of the patient were reviewed by me and considered in my medical decision making (see chart for details).  Clinical Course as of Sep 16 2137  Sat Sep 17, 2019  1855 Normal  POC beta hCG blood, ED [EW]  1855 Normal except small amount of bilirubin and protein.  Urinalysis, Routine w reflex microscopic(!) [EW]  1855 Normal  Lipase, blood [EW]  1855 Normal except potassium low, glucose high, calcium low, AST high, ALT high  Comprehensive metabolic panel(!) [EW]  99991111 Normal  CBC with Differential(!) [EW]  2130 Normal  POC beta hCG blood, ED [EW]  2131 Per radiologist, nonspecific finding in sigmoid colon, colitis versus decompression.   [EW]    Clinical Course User Index [EW] Daleen Bo, MD   MDM Rules/Calculators/A&P                       Patient Vitals for the past 24 hrs:  BP Temp Temp src Pulse Resp SpO2 Weight  09/17/19 2138 122/74 -- -- (!) 57 18 100 % --  09/17/19 2045 -- -- -- (!) 54 -- 100 % --  09/17/19 2041 126/66 -- -- (!) 54 18 100 % --  09/17/19 2000 127/86 -- -- (!) 55 -- 100 % --  09/17/19 1930 111/80 -- -- (!) 56 -- 100 % --  09/17/19 1905 106/68 -- -- 62 18 99 % --  09/17/19 1900 106/68 -- -- 61 -- 100 % --  09/17/19 1708 -- -- -- -- -- -- 94.8 kg  09/17/19 1705 123/72 98.4 F (36.9 C) Oral 78 19 98 % --    9:31 PM Reevaluation with update and discussion. After initial assessment and treatment, an updated evaluation reveals she states she feels better and is comfortable enough to go home.  She requests ibuprofen 800 mg to use for pain at home.  Findings discussed and questions answered. Daleen Bo   Medical Decision  Making:  This patient is presenting for evaluation of nausea, vomiting and diarrhea with abdominal pain, which does require a range of treatment options, and is a complaint that involves a moderate risk of morbidity and mortality. The differential diagnoses include gastroenteritis, appendicitis, gallbladder disease, metabolic instability. I decided  to review old records, and in summary healthy young female. I did not require additional historical information from anyone. Clinical Laboratory Tests Ordered, included CBC, metabolic panel, urinalysis, lipase, pregnancy test.  Labs reviewed, mild transaminitis, otherwise normal. Radiologic Tests Ordered, included CT abdomen pelvis. I independently Visualized: CT images images, which show possible decompressed sigmoid colon.  Critical Interventions-clinical evaluation, laboratory testing, CT imaging, medical therapy, IV fluids  After These Interventions, the Patient was reevaluated and was found comfortable stable for discharge.  Nonspecific vomiting and diarrhea, with abdominal pain.  Doubt colitis or diverticulitis.  Unspecified transaminitis, possibly related to food poisoning.  No indication for further ED treatment or hospitalization at this time  CRITICAL CARE-no Performed by: Daleen Bo   Nursing Notes Reviewed/ Care Coordinated Applicable Imaging Reviewed Interpretation of Laboratory Data incorporated into ED treatment  The  patient appears reasonably screened and/or stabilized for discharge and I doubt any other medical condition or other [facility] requiring further screening, evaluation, or treatment in the ED at this time prior to discharge.  Plan: Home Medications-continue current medications; Home Treatments-gradual advance diet; return here if the recommended treatment, does not improve the symptoms; Recommended follow up-PCP checkup as needed.  Final Clinical Impression(s) / ED Diagnoses Final diagnoses:  Generalized abdominal pain   Nausea and vomiting, intractability of vomiting not specified, unspecified vomiting type  Transaminitis    Rx / DC Orders ED Discharge Orders         Ordered    ibuprofen (ADVIL) 800 MG tablet  Every 8 hours PRN     09/17/19 2138    ondansetron (ZOFRAN) 8 MG tablet  Every 8 hours PRN     09/17/19 2138           Daleen Bo, MD 09/17/19 2202

## 2019-09-17 NOTE — ED Notes (Signed)
Pt lying in bed. NAD noted. Pt is ready for d/c

## 2019-09-17 NOTE — ED Triage Notes (Signed)
Per patient, her stomach has "been in a knot all week", woke up this morning with diarrhea with blood, nausea/vomiting and headache. Patient says she was also sweating. Denies covid exposure - patient states she is worried it is her appendix

## 2019-09-17 NOTE — ED Notes (Signed)
Pt up walking to the bathroom. Pt asking for pain meds. MD notified. Will continue to monitor.

## 2019-09-17 NOTE — Discharge Instructions (Addendum)
The exact cause of your discomfort is not clear.  We are giving you some pain medicine and nausea medicine to use to help your symptoms.  2 of your liver function tests were slightly abnormal.  We sent a hepatitis panel, which has not returned yet.  Follow-up with your doctor for checkup in a week or so to check on the findings of the hepatitis panel and a general medical screening.

## 2019-09-17 NOTE — ED Notes (Signed)
Pt lying in bed, eye's closed, chest rising and falling. Pt awakens easily. Updated on plan of care. NAD noted. Will continue to monitor.

## 2019-09-18 LAB — HEPATITIS PANEL, ACUTE
HCV Ab: NONREACTIVE
Hep A IgM: NONREACTIVE
Hep B C IgM: NONREACTIVE
Hepatitis B Surface Ag: NONREACTIVE

## 2019-09-21 ENCOUNTER — Encounter: Payer: Self-pay | Admitting: Nurse Practitioner

## 2019-09-21 ENCOUNTER — Ambulatory Visit: Payer: Medicaid Other | Admitting: Nurse Practitioner

## 2019-09-21 ENCOUNTER — Other Ambulatory Visit: Payer: Self-pay | Admitting: Nurse Practitioner

## 2019-09-21 ENCOUNTER — Other Ambulatory Visit: Payer: Self-pay

## 2019-09-21 VITALS — BP 130/80 | HR 93 | Temp 97.5°F | Ht 63.0 in | Wt 208.2 lb

## 2019-09-21 DIAGNOSIS — R1013 Epigastric pain: Secondary | ICD-10-CM | POA: Diagnosis not present

## 2019-09-21 DIAGNOSIS — R7401 Elevation of levels of liver transaminase levels: Secondary | ICD-10-CM

## 2019-09-21 DIAGNOSIS — G44029 Chronic cluster headache, not intractable: Secondary | ICD-10-CM

## 2019-09-21 MED ORDER — RIZATRIPTAN BENZOATE 5 MG PO TABS: 5.0000 mg | ORAL_TABLET | ORAL | 0 refills | Status: AC | PRN

## 2019-09-21 MED ORDER — SUCRALFATE 1 G PO TABS: 1.0000 g | ORAL_TABLET | Freq: Three times a day (TID) | ORAL | 1 refills | Status: DC

## 2019-09-21 NOTE — Telephone Encounter (Signed)
Baldo Ash please advise, ok to send in 90 days supply. We only send in 2 mo supply.

## 2019-09-21 NOTE — Patient Instructions (Addendum)
Go to lab for blood draw maintain  Low fat diet, avoid beef intake Avoid any OTC medication  And ETOH at this time.  Use only sucralfate for heart burn.  Use maxalt for migraine Will send topamax if improved liver enzymes.

## 2019-09-21 NOTE — Progress Notes (Signed)
 Subjective:  Patient ID: Mary Harrington, female    DOB: 10/28/1985  Age: 33 y.o. MRN: [id number]  CC: Hospitalization Follow-up (f/p to abd pain, nausea and vomitting-no covid shots//need maxalt refill-pt sttaed stomach still sore but better)  HPI Abnormal liver panel: Evaluated by ED provider on 09/17/2019, reviewed provider notes, lab and radiology report. Today she reports Persistent heartburn, Normal BM today, Resolved nausea, able to eat and drink adequately.  Headaches: Chronic, daily x 1month, use of maxalt/tylenol/ibuprofen with minimal relief. No change in characteristics.  Reviewed past Medical, Social and Family history today.  Outpatient Medications Prior to Visit  Medication Sig Dispense Refill  . albuterol (VENTOLIN HFA) 108 (90 Base) MCG/ACT inhaler Inhale 1 puff into the lungs every 6 (six) hours as needed for wheezing or shortness of breath. 6.7 g 0  . Emollient (CERAVE SA RENEWING) CREA Apply 1 application topically 3 (three) times daily.    . fluticasone (FLONASE) 50 MCG/ACT nasal spray SHAKE LIQUID AND USE 2 SPRAYS IN EACH NOSTRIL DAILY (Patient taking differently: Place 2 sprays into both nostrils daily. ) 16 g 5  . ibuprofen (ADVIL) 800 MG tablet Take 1 tablet (800 mg total) by mouth every 8 (eight) hours as needed for moderate pain. 21 tablet 0  . montelukast (SINGULAIR) 10 MG tablet Take 1 tablet (10 mg total) by mouth at bedtime. 30 tablet 5  . triamcinolone ointment (KENALOG) 0.5 % Apply 1 application topically 2 (two) times daily. 100 g 0  . rizatriptan (MAXALT) 5 MG tablet Take 1 tablet (5 mg total) by mouth as needed for migraine. May repeat in 2 hours if needed. No more than 4tab in 24hrs 10 tablet 0  . buPROPion (WELLBUTRIN) 75 MG tablet Take 1 tablet (75 mg total) by mouth every morning. (Patient not taking: Reported on 09/21/2019) 90 tablet 1  . metroNIDAZOLE (METROGEL VAGINAL) 0.75 % vaginal gel Place 1 Applicatorful vaginally at bedtime. (Patient not  taking: Reported on 08/22/2019) 70 g 0  . ondansetron (ZOFRAN) 8 MG tablet Take 1 tablet (8 mg total) by mouth every 8 (eight) hours as needed for nausea or vomiting. (Patient not taking: Reported on 09/21/2019) 20 tablet 0   No facility-administered medications prior to visit.    ROS See HPI  Objective:  BP 130/80   Pulse 93   Temp (!) 97.5 F (36.4 C) (Tympanic)   Ht 5' 3" (1.6 m)   Wt 208 lb 3.2 oz (94.4 kg)   LMP 01/01/2017 (Exact Date)   SpO2 97%   BMI 36.88 kg/m   BP Readings from Last 3 Encounters:  09/21/19 130/80  09/17/19 122/74  07/06/19 118/72   Wt Readings from Last 3 Encounters:  09/21/19 208 lb 3.2 oz (94.4 kg)  09/17/19 209 lb (94.8 kg)  07/06/19 213 lb (96.6 kg)   Physical Exam Constitutional:      Appearance: She is obese.  Cardiovascular:     Rate and Rhythm: Normal rate.     Pulses: Normal pulses.     Heart sounds: Normal heart sounds.  Pulmonary:     Effort: Pulmonary effort is normal.     Breath sounds: Normal breath sounds.  Abdominal:     General: Bowel sounds are normal. There is no distension.     Palpations: Abdomen is soft. There is no mass.     Tenderness: There is abdominal tenderness in the left upper quadrant. There is no right CVA tenderness, left CVA tenderness or guarding.  Musculoskeletal:       Right lower leg: No edema.     Left lower leg: No edema.  Neurological:     Mental Status: She is alert and oriented to person, place, and time.    Lab Results  Component Value Date   WBC 5.4 09/17/2019   HGB 13.2 09/17/2019   HCT 40.3 09/17/2019   PLT 213 09/17/2019   GLUCOSE 110 (H) 09/17/2019   CHOL 172 05/25/2019   TRIG 74 05/25/2019   HDL 41 05/25/2019   LDLCALC 117 (H) 05/25/2019   ALT 159 (H) 09/21/2019   AST 32 09/21/2019   NA 138 09/17/2019   K 3.3 (L) 09/17/2019   CL 106 09/17/2019   CREATININE 0.84 09/17/2019   BUN 11 09/17/2019   CO2 23 09/17/2019   TSH 1.760 05/25/2019   HGBA1C 5.6 09/03/2017    CT Abdomen  Pelvis W Contrast  Result Date: 09/17/2019 CLINICAL DATA:  Abdominal abscess/infection EXAM: CT ABDOMEN AND PELVIS WITH CONTRAST TECHNIQUE: Multidetector CT imaging of the abdomen and pelvis was performed using the standard protocol following bolus administration of intravenous contrast. CONTRAST:  110m OMNIPAQUE IOHEXOL 300 MG/ML  SOLN COMPARISON:  None. FINDINGS: Lower chest: The visualized heart size within normal limits. No pericardial fluid/thickening. No hiatal hernia. The visualized portions of the lungs are clear. Hepatobiliary: A tiny hypodensity seen within the inferior right liver lobe.The main portal vein is patent. No evidence of calcified gallstones, gallbladder wall thickening or biliary dilatation. Pancreas: Unremarkable. No pancreatic ductal dilatation or surrounding inflammatory changes. Spleen: Normal in size without focal abnormality. Adrenals/Urinary Tract: Both adrenal glands appear normal. There is a tiny 4 mm hypodense lesion within the upper pole of left kidney. No urinary tract calculus or hydronephrosis. Bladder is unremarkable. Stomach/Bowel: The stomach, small bowel, are normal in appearance. There is question of a focal segment of sigmoid colon with mild wall thickening versus decompression, series 2, image 63. No surrounding fat stranding changes however are noted. No loculated fluid collections or free air. The appendix is unremarkable. Vascular/Lymphatic: There are no enlarged mesenteric, retroperitoneal, or pelvic lymph nodes. No significant vascular findings are present. Reproductive: The patient is status post hysterectomy. No adnexal masses or collections seen. Other: Fat containing anterior umbilical hernia noted. Musculoskeletal: No acute or significant osseous findings. IMPRESSION: Mild wall thickening of a focal segment of sigmoid colon could be due to decompression versus true wall thickening from mild colitis. No significant however surrounding inflammatory changes.  Electronically Signed   By: BPrudencio PairM.D.   On: 09/17/2019 20:42    Assessment & Plan:  This visit occurred during the SARS-CoV-2 public health emergency.  Safety protocols were in place, including screening questions prior to the visit, additional usage of staff PPE, and extensive cleaning of exam room while observing appropriate contact time as indicated for disinfecting solutions.   SMahailawas seen today for hospitalization follow-up.  Diagnoses and all orders for this visit:  Transaminitis -     Cancel: Hepatic function panel -     Cancel: Lipase -     Cancel: Hepatic function panel -     Cancel: Lipase -     Hepatic function panel -     Lipase  Chronic cluster headache, not intractable -     rizatriptan (MAXALT) 5 MG tablet; Take 1 tablet (5 mg total) by mouth as needed for migraine. May repeat in 2 hours if needed. No more than 4tab in 24hrs -     topiramate (TOPAMAX) 25 MG tablet; Take  1 tablet (25 mg total) by mouth 2 (two) times daily.  Dyspepsia -     sucralfate (CARAFATE) 1 g tablet; Take 1 tablet (1 g total) by mouth 4 (four) times daily -  with meals and at bedtime.   I have discontinued Henriette A. Kohls's metroNIDAZOLE and ondansetron. I am also having her start on sucralfate and topiramate. Additionally, [name] maintain her albuterol, fluticasone, montelukast, buPROPion, CeraVe SA Renewing, triamcinolone ointment, ibuprofen, and rizatriptan.  Meds ordered this encounter  Medications  . sucralfate (CARAFATE) 1 g tablet    Sig: Take 1 tablet (1 g total) by mouth 4 (four) times daily -  with meals and at bedtime.    Dispense:  120 tablet    Refill:  1    Order Specific Question:   Supervising Provider    Answer:   CIRIGLIANO, MARY K [[id number]]  . rizatriptan (MAXALT) 5 MG tablet    Sig: Take 1 tablet (5 mg total) by mouth as needed for migraine. May repeat in 2 hours if needed. No more than 4tab in 24hrs    Dispense:  10 tablet    Refill:  0    Order  Specific Question:   Supervising Provider    Answer:   CIRIGLIANO, MARY K [[id number]]  . topiramate (TOPAMAX) 25 MG tablet    Sig: Take 1 tablet (25 mg total) by mouth 2 (two) times daily.    Dispense:  60 tablet    Refill:  5    Order Specific Question:   Supervising Provider    Answer:   CIRIGLIANO, MARY K [[id number]]    Problem List Items Addressed This Visit      Other   Dyspepsia   Relevant Medications   sucralfate (CARAFATE) 1 g tablet   Headache    Daily headache Mild improvement with maxalt, tylenol and ibuptofen.  Start topamax 25mg BID Continue maxalt prn F/up in 1month      Relevant Medications   rizatriptan (MAXALT) 5 MG tablet   topiramate (TOPAMAX) 25 MG tablet   Transaminitis - Primary    Improved Resolved nausea and ABD pain. Normal liver and pancreas per CT ABD/pelvis F/up in 1month Advised to maintain  Low fat diet, avoid beef intake Avoid any OTC medication  And ETOH at this time.  Hepatic Function Latest Ref Rng & Units 09/21/2019 09/17/2019 09/03/2017  Total Protein 6.0 - 8.3 g/dL 6.8 7.0 6.7  Albumin 3.5 - 5.2 g/dL 4.0 3.8 4.1  AST 0 - 37 U/L 32 423(H) 13  ALT 0 - 35 U/L 159(H) 220(H) 10  Alk Phosphatase 39 - 117 U/L 70 58 52  Total Bilirubin 0.2 - 1.2 mg/dL 0.3 0.9 0.4  Bilirubin, Direct 0.0 - 0.3 mg/dL 0.1 - -        Relevant Orders   Hepatic function panel (Completed)   Lipase (Completed)      Follow-up: Return in about 4 weeks (around 10/19/2019) for dyspepsia and headache (complete PHQ/GAD).  Charlotte Nche, NP 

## 2019-09-22 DIAGNOSIS — R7401 Elevation of levels of liver transaminase levels: Secondary | ICD-10-CM | POA: Insufficient documentation

## 2019-09-22 DIAGNOSIS — R1013 Epigastric pain: Secondary | ICD-10-CM | POA: Insufficient documentation

## 2019-09-22 LAB — HEPATIC FUNCTION PANEL
ALT: 159 U/L — ABNORMAL HIGH (ref 0–35)
AST: 32 U/L (ref 0–37)
Albumin: 4 g/dL (ref 3.5–5.2)
Alkaline Phosphatase: 70 U/L (ref 39–117)
Bilirubin, Direct: 0.1 mg/dL (ref 0.0–0.3)
Total Bilirubin: 0.3 mg/dL (ref 0.2–1.2)
Total Protein: 6.8 g/dL (ref 6.0–8.3)

## 2019-09-22 LAB — LIPASE: Lipase: 3 U/L — ABNORMAL LOW (ref 11.0–59.0)

## 2019-09-22 MED ORDER — TOPIRAMATE 25 MG PO TABS: 25.0000 mg | ORAL_TABLET | Freq: Two times a day (BID) | ORAL | 5 refills | Status: AC

## 2019-09-22 NOTE — Assessment & Plan Note (Addendum)
Improved Resolved nausea and ABD pain. Normal liver and pancreas per CT ABD/pelvis F/up in 28monthAdvised to maintain  Low fat diet, avoid beef intake Avoid any OTC medication  And ETOH at this time.  Hepatic Function Latest Ref Rng & Units 09/21/2019 09/17/2019 09/03/2017  Total Protein 6.0 - 8.3 g/dL 6.8 7.0 6.7  Albumin 3.5 - 5.2 g/dL 4.0 3.8 4.1  AST 0 - 37 U/L 32 423(H) 13  ALT 0 - 35 U/L 159(H) 220(H) 10  Alk Phosphatase 39 - 117 U/L 70 58 52  Total Bilirubin 0.2 - 1.2 mg/dL 0.3 0.9 0.4  Bilirubin, Direct 0.0 - 0.3 mg/dL 0.1 - -

## 2019-09-22 NOTE — Assessment & Plan Note (Signed)
Daily headache Mild improvement with maxalt, tylenol and ibuptofen.  Start topamax 25mg  BID Continue maxalt prn F/up in 17month

## 2019-10-26 ENCOUNTER — Telehealth: Payer: Self-pay
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
## 2019-11-02 ENCOUNTER — Ambulatory Visit: Payer: Medicaid Other | Admitting: Nurse Practitioner

## 2019-11-08 ENCOUNTER — Telehealth: Payer: Self-pay | Admitting: Nurse Practitioner

## 2019-11-08 ENCOUNTER — Other Ambulatory Visit: Payer: Self-pay

## 2019-11-08 NOTE — Telephone Encounter (Signed)
Patient's insurance is [employer] and she was informed that we do not accept that insurance as primary. Patient was upset and argued that she has regular Medicaid only. I re-verified via [employer] and [employer] and it showed [place] [employer]. I gave her those results and she then hung up. Primary care is listed as [facility], [address] [place], [place] [address].

## 2019-11-09 ENCOUNTER — Ambulatory Visit: Payer: Medicaid Other | Admitting: Nurse Practitioner
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
## 2019-11-10 ENCOUNTER — Ambulatory Visit: Payer: Medicaid Other | Admitting: Nurse Practitioner

## 2019-11-14 ENCOUNTER — Other Ambulatory Visit: Payer: Self-pay

## 2019-11-14 ENCOUNTER — Encounter: Payer: Self-pay | Admitting: Nurse Practitioner

## 2019-11-14 ENCOUNTER — Ambulatory Visit (INDEPENDENT_AMBULATORY_CARE_PROVIDER_SITE_OTHER): Payer: Medicaid Other | Admitting: Nurse Practitioner

## 2019-11-14 VITALS — BP 118/62 | HR 92 | Temp 97.6°F | Ht 63.0 in | Wt 203.0 lb

## 2019-11-14 DIAGNOSIS — G44029 Chronic cluster headache, not intractable: Secondary | ICD-10-CM

## 2019-11-14 DIAGNOSIS — R7401 Elevation of levels of liver transaminase levels: Secondary | ICD-10-CM | POA: Diagnosis not present

## 2019-11-14 NOTE — Patient Instructions (Addendum)
Normal ALT.  Resume topamax for migraines.  Topiramate tablets What is this medicine? TOPIRAMATE (toe PYRE a mate) is used to treat seizures in adults or children with epilepsy. It is also used for the prevention of migraine headaches. This medicine may be used for other purposes; ask your health care provider or pharmacist if you have questions. COMMON BRAND NAME(S): Topamax, Topiragen What should I tell my health care provider before I take this medicine? They need to know if you have any of these conditions:  bleeding disorders  kidney disease  lung or breathing disease, like asthma  suicidal thoughts, plans, or attempt; a previous suicide attempt by you or a family member  an unusual or allergic reaction to topiramate, other medicines, foods, dyes, or preservatives  pregnant or trying to get pregnant  breast-feeding How should I use this medicine? Take this medicine by mouth with a glass of water. Follow the directions on the prescription label. Do not cut, crush or chew this medicine. Swallow the tablets whole. You can take it with or without food. If it upsets your stomach, take it with food. Take your medicine at regular intervals. Do not take it more often than directed. Do not stop taking except on your doctor's advice. A special MedGuide will be given to you by the pharmacist with each prescription and refill. Be sure to read this information carefully each time. Talk to your pediatrician regarding the use of this medicine in children. While this drug may be prescribed for children as young as 30 years of age for selected conditions, precautions do apply. Overdosage: If you think you have taken too much of this medicine contact a poison control center or emergency room at once. NOTE: This medicine is only for you. Do not share this medicine with others. What if I miss a dose? If you miss a dose, take it as soon as you can. If your next dose is to be taken in less than 6  hours, then do not take the missed dose. Take the next dose at your regular time. Do not take double or extra doses. What may interact with this medicine? This medicine may interact with the following medications:  acetazolamide  alcohol  antihistamines for allergy, cough, and cold  aspirin and aspirin-like medicines  atropine  birth control pills  certain medicines for anxiety or sleep  certain medicines for bladder problems like oxybutynin, tolterodine  certain medicines for depression like amitriptyline, fluoxetine, sertraline  certain medicines for seizures like carbamazepine, phenobarbital, phenytoin, primidone, valproic acid, zonisamide  certain medicines for stomach problems like dicyclomine, hyoscyamine  certain medicines for travel sickness like scopolamine  certain medicines for Parkinson's disease like benztropine, trihexyphenidyl  certain medicines that treat or prevent blood clots like warfarin, enoxaparin, dalteparin, apixaban, dabigatran, and rivaroxaban  digoxin  general anesthetics like halothane, isoflurane, methoxyflurane, propofol  hydrochlorothiazide  ipratropium  lithium  medicines that relax muscles for surgery  metformin  narcotic medicines for pain  NSAIDs, medicines for pain and inflammation, like ibuprofen or naproxen  phenothiazines like chlorpromazine, mesoridazine, prochlorperazine, thioridazine  pioglitazone This list may not describe all possible interactions. Give your health care provider a list of all the medicines, herbs, non-prescription drugs, or dietary supplements you use. Also tell them if you smoke, drink alcohol, or use illegal drugs. Some items may interact with your medicine. What should I watch for while using this medicine? Visit your doctor or health care professional for regular checks on your progress. Tell your  health care professional if your symptoms do not start to get better or if they get worse. Do not  stop taking except on your health care professional's advice. You may develop a severe reaction. Your health care professional will tell you how much medicine to take. Wear a medical ID bracelet or chain. Carry a card that describes your disease and details of your medicine and dosage times. This medicine can reduce the response of your body to heat or cold. Dress warm in cold weather and stay hydrated in hot weather. If possible, avoid extreme temperatures like saunas, hot tubs, very hot or cold showers, or activities that can cause dehydration such as vigorous exercise. Check with your health care professional if you have severe diarrhea, nausea, and vomiting, or if you sweat a lot. The loss of too much body fluid may make it dangerous for you to take this medicine. You may get drowsy or dizzy. Do not drive, use machinery, or do anything that needs mental alertness until you know how this medicine affects you. Do not stand up or sit up quickly, especially if you are an older patient. This reduces the risk of dizzy or fainting spells. Alcohol may interfere with the effect of this medicine. Avoid alcoholic drinks. Tell your health care professional right away if you have any change in your eyesight. Patients and their families should watch out for new or worsening depression or thoughts of suicide. Also watch out for sudden changes in feelings such as feeling anxious, agitated, panicky, irritable, hostile, aggressive, impulsive, severely restless, overly excited and hyperactive, or not being able to sleep. If this happens, especially at the beginning of treatment or after a change in dose, call your healthcare professional. This medicine may cause serious skin reactions. They can happen weeks to months after starting the medicine. Contact your health care provider right away if you notice fevers or flu-like symptoms with a rash. The rash may be red or purple and then turn into blisters or peeling of the skin.  Or, you might notice a red rash with swelling of the face, lips or lymph nodes in your neck or under your arms. Birth control may not work properly while you are taking this medicine. Talk to your health care professional about using an extra method of birth control. Women should inform their health care professional if they wish to become pregnant or think they might be pregnant. There is a potential for serious side effects and harm to an unborn child. Talk to your health care professional for more information. What side effects may I notice from receiving this medicine? Side effects that you should report to your doctor or health care professional as soon as possible:  allergic reactions like skin rash, itching or hives, swelling of the face, lips, or tongue  blood in the urine  changes in vision  confusion  loss of memory  pain in lower back or side  pain when urinating  redness, blistering, peeling or loosening of the skin, including inside the mouth  signs and symptoms of bleeding such as bloody or black, tarry stools; red or dark brown urine; spitting up blood or brown material that looks like coffee grounds; red spots on the skin; unusual bruising or bleeding from the eyes, gums, or nose  signs and symptoms of increased acid in the body like breathing fast; fast heartbeat; headache; confusion; unusually weak or tired; nausea, vomiting  suicidal thoughts, mood changes  trouble speaking or understanding  unusual  sweating  unusually weak or tired Side effects that usually do not require medical attention (report to your doctor or health care professional if they continue or are bothersome):  dizziness  drowsiness  fever  loss of appetite  nausea, vomiting  pain, tingling, numbness in the hands or feet  stomach pain  tiredness  upset stomach This list may not describe all possible side effects. Call your doctor for medical advice about side effects. You may  report side effects to FDA at 1-800-FDA-1088. Where should I keep my medicine? Keep out of the reach of children. Store at room temperature between 15 and 30 degrees C (59 and 86 degrees F). Throw away any unused medicine after the expiration date. NOTE: This sheet is a summary. It may not cover all possible information. If you have questions about this medicine, talk to your doctor, pharmacist, or health care provider.  2020 Elsevier/Gold Standard (2018-12-09 15:07:20)

## 2019-11-14 NOTE — Assessment & Plan Note (Addendum)
Hesitant about taking topamax and maxalt due to concern about liver toxicity. Reports increased headache frequency (2-3x/week) in last 2-3weeks. No neurologic deficit. Advised to resume topamax, for it has no impact on liver function. Unable to referral to neurology due to her current insurance. Advised to call office for referral if her insurance changes.

## 2019-11-14 NOTE — Progress Notes (Signed)
Subjective:  Patient ID: Mary Harrington, female    DOB: Jun 02, 1985  Age: 34 y.o. MRN: [id number]  CC: Follow-up (lab work for liver//pt is not fasting//no covid vaccinations)  HPI Elevated ALT: Denies any GI symptoms today. She denies all medications due to concern about possible liver injury. Admits to occasional ETOH intake.  Denies use of OTC tylenol or ibuprofen  Reviewed past Medical, Social and Family history today.  Outpatient Medications Prior to Visit  Medication Sig Dispense Refill  . albuterol (VENTOLIN HFA) 108 (90 Base) MCG/ACT inhaler Inhale 1 puff into the lungs every 6 (six) hours as needed for wheezing or shortness of breath. 6.7 g 0  . Emollient (CERAVE SA RENEWING) CREA Apply 1 application topically 3 (three) times daily.    . rizatriptan (MAXALT) 5 MG tablet Take 1 tablet (5 mg total) by mouth as needed for migraine. May repeat in 2 hours if needed. No more than 4tab in 24hrs 10 tablet 0  . topiramate (TOPAMAX) 25 MG tablet Take 1 tablet (25 mg total) by mouth 2 (two) times daily. 60 tablet 5  . triamcinolone ointment (KENALOG) 0.5 % Apply 1 application topically 2 (two) times daily. 100 g 0  . ibuprofen (ADVIL) 800 MG tablet Take 1 tablet (800 mg total) by mouth every 8 (eight) hours as needed for moderate pain. 21 tablet 0  . sucralfate (CARAFATE) 1 g tablet Take 1 tablet (1 g total) by mouth 4 (four) times daily -  with meals and at bedtime. 120 tablet 1  . buPROPion (WELLBUTRIN) 75 MG tablet Take 1 tablet (75 mg total) by mouth every morning. (Patient not taking: Reported on 09/21/2019) 90 tablet 1  . fluticasone (FLONASE) 50 MCG/ACT nasal spray SHAKE LIQUID AND USE 2 SPRAYS IN EACH NOSTRIL DAILY (Patient not taking: Reported on 11/14/2019) 16 g 5  . montelukast (SINGULAIR) 10 MG tablet Take 1 tablet (10 mg total) by mouth at bedtime. (Patient not taking: Reported on 11/14/2019) 30 tablet 5   No facility-administered medications prior to visit.    ROS See  HPI  Objective:  BP 118/62   Pulse 92   Temp 97.6 F (36.4 C) (Tympanic)   Ht 5\' 3"  (1.6 m)   Wt 203 lb (92.1 kg)   LMP 01/01/2017 (Exact Date)   SpO2 98%   BMI 35.96 kg/m   BP Readings from Last 3 Encounters:  11/14/19 118/62  09/21/19 130/80  09/17/19 122/74   Wt Readings from Last 3 Encounters:  11/14/19 203 lb (92.1 kg)  09/21/19 208 lb 3.2 oz (94.4 kg)  09/17/19 209 lb (94.8 kg)   Physical Exam Vitals and nursing note reviewed.  Constitutional:      Appearance: She is obese.  Eyes:     Extraocular Movements: Extraocular movements intact.     Conjunctiva/sclera: Conjunctivae normal.  Cardiovascular:     Rate and Rhythm: Normal rate.     Pulses: Normal pulses.  Pulmonary:     Effort: Pulmonary effort is normal.  Abdominal:     General: Bowel sounds are normal. There is no distension.     Palpations: Abdomen is soft. There is no mass.     Tenderness: There is no abdominal tenderness. There is no guarding.  Neurological:     General: No focal deficit present.     Mental Status: She is alert and oriented to person, place, and time.  Psychiatric:        Mood and Affect: Mood normal.  Behavior: Behavior normal.        Thought Content: Thought content normal.    Lab Results  Component Value Date   WBC 5.4 09/17/2019   HGB 13.2 09/17/2019   HCT 40.3 09/17/2019   PLT 213 09/17/2019   GLUCOSE 110 (H) 09/17/2019   CHOL 172 05/25/2019   TRIG 74 05/25/2019   HDL 41 05/25/2019   LDLCALC 117 (H) 05/25/2019   ALT 8 11/14/2019   AST 11 11/14/2019   NA 138 09/17/2019   K 3.3 (L) 09/17/2019   CL 106 09/17/2019   CREATININE 0.84 09/17/2019   BUN 11 09/17/2019   CO2 23 09/17/2019   TSH 1.760 05/25/2019   HGBA1C 5.6 09/03/2017    CT Abdomen Pelvis W Contrast  Result Date: 09/17/2019 CLINICAL DATA:  Abdominal abscess/infection EXAM: CT ABDOMEN AND PELVIS WITH CONTRAST TECHNIQUE: Multidetector CT imaging of the abdomen and pelvis was performed using the  standard protocol following bolus administration of intravenous contrast. CONTRAST:  116mL OMNIPAQUE IOHEXOL 300 MG/ML  SOLN COMPARISON:  None. FINDINGS: Lower chest: The visualized heart size within normal limits. No pericardial fluid/thickening. No hiatal hernia. The visualized portions of the lungs are clear. Hepatobiliary: A tiny hypodensity seen within the inferior right liver lobe.The main portal vein is patent. No evidence of calcified gallstones, gallbladder wall thickening or biliary dilatation. Pancreas: Unremarkable. No pancreatic ductal dilatation or surrounding inflammatory changes. Spleen: Normal in size without focal abnormality. Adrenals/Urinary Tract: Both adrenal glands appear normal. There is a tiny 4 mm hypodense lesion within the upper pole of left kidney. No urinary tract calculus or hydronephrosis. Bladder is unremarkable. Stomach/Bowel: The stomach, small bowel, are normal in appearance. There is question of a focal segment of sigmoid colon with mild wall thickening versus decompression, series 2, image 63. No surrounding fat stranding changes however are noted. No loculated fluid collections or free air. The appendix is unremarkable. Vascular/Lymphatic: There are no enlarged mesenteric, retroperitoneal, or pelvic lymph nodes. No significant vascular findings are present. Reproductive: The patient is status post hysterectomy. No adnexal masses or collections seen. Other: Fat containing anterior umbilical hernia noted. Musculoskeletal: No acute or significant osseous findings. IMPRESSION: Mild wall thickening of a focal segment of sigmoid colon could be due to decompression versus true wall thickening from mild colitis. No significant however surrounding inflammatory changes. Electronically Signed   By: Prudencio Pair M.D.   On: 09/17/2019 20:42    Assessment & Plan:  This visit occurred during the SARS-CoV-2 public health emergency.  Safety protocols were in place, including screening  questions prior to the visit, additional usage of staff PPE, and extensive cleaning of exam room while observing appropriate contact time as indicated for disinfecting solutions.   Mary Harrington was seen today for follow-up.  Diagnoses and all orders for this visit:  Transaminitis -     Hepatic function panel  Chronic cluster headache, not intractable   I have discontinued Brinley A. Mabry's fluticasone, montelukast, buPROPion, ibuprofen, and sucralfate. I am also having her maintain her albuterol, CeraVe SA Renewing, triamcinolone ointment, rizatriptan, and topiramate.  No orders of the defined types were placed in this encounter.   Problem List Items Addressed This Visit      Other   Headache    Hesitant about taking topamax and maxalt due to concern about liver toxicity. Reports increased headache frequency (2-3x/week) in last 2-3weeks. No neurologic deficit. Advised to resume topamax, for it has no impact on liver function. Unable to referral to neurology due  to her current insurance. Advised to call office for referral if her insurance changes.       Transaminitis - Primary   Relevant Orders   Hepatic function panel (Completed)      Follow-up: Return in about 3 months (around 02/14/2020) for CPE (fasting).  Wilfred Lacy, NP

## 2019-11-15 LAB — HEPATIC FUNCTION PANEL
ALT: 8 IU/L (ref 0–32)
AST: 11 IU/L (ref 0–40)
Albumin: 4.1 g/dL (ref 3.8–4.8)
Alkaline Phosphatase: 61 IU/L (ref 48–121)
Bilirubin Total: <0.2 mg/dL (ref 0.0–1.2)
Bilirubin, Direct: 0.07 mg/dL (ref 0.00–0.40)
Total Protein: 6.3 g/dL (ref 6.0–8.5)

## 2019-11-23 ENCOUNTER — Other Ambulatory Visit: Payer: Self-pay | Admitting: Nurse Practitioner

## 2019-11-23 DIAGNOSIS — J302 Other seasonal allergic rhinitis: Secondary | ICD-10-CM

## 2019-12-28 ENCOUNTER — Other Ambulatory Visit: Payer: Self-pay | Admitting: Nurse Practitioner

## 2019-12-28 DIAGNOSIS — J302 Other seasonal allergic rhinitis: Secondary | ICD-10-CM

## 2020-01-02 ENCOUNTER — Encounter: Payer: Medicaid Other | Admitting: Nurse Practitioner

## 2020-02-06 ENCOUNTER — Other Ambulatory Visit: Payer: Self-pay | Admitting: Obstetrics and Gynecology

## 2020-02-06 DIAGNOSIS — Z1231 Encounter for screening mammogram for malignant neoplasm of breast: Secondary | ICD-10-CM

## 2020-02-08 ENCOUNTER — Encounter: Payer: Medicaid Other | Admitting: Nurse Practitioner

## 2020-03-05 ENCOUNTER — Encounter: Payer: Self-pay | Admitting: Nurse Practitioner

## 2020-03-05 ENCOUNTER — Telehealth: Payer: Self-pay | Admitting: Nurse Practitioner

## 2020-03-05 NOTE — Telephone Encounter (Signed)
Pt was no show for appt 02/08/2020. First occurrence. Fee waived. Letter mailed.  PCP,  Please reply back with corresponding letter matching appropriate follow up needs.  A - No follow up necessary B - Follow up urgent - locate patient immediately to schedule appointment. C - Follow up necessary. Contact patient and schedule visit w/in 7 days. D - Follow up necessary. Contact patient and schedule visit w/in 2-4 weeks.  E - Follow up necessary. Contact patient and schedule visit w/in 3 months.

## 2020-03-05 NOTE — Telephone Encounter (Signed)
D, she is overdue for CPE

## 2020-03-08 NOTE — Telephone Encounter (Signed)
Spoke with pt. She stated that [facility] told her [facility] didn't accept [employer]. I advised pt that we do and provided her the group NPI #. She is going to call [facility] and then call back.

## 2020-11-23 ENCOUNTER — Ambulatory Visit: Payer: Self-pay

## 2020-11-27 ENCOUNTER — Other Ambulatory Visit: Payer: Self-pay

## 2020-11-27 ENCOUNTER — Ambulatory Visit
Admission: RE | Admit: 2020-11-27 | Discharge: 2020-11-27 | Disposition: A | Discharge status: Home / Self Care | Payer: Medicaid Other | Source: Ambulatory Visit | Attending: Internal Medicine | Admitting: Internal Medicine

## 2020-11-27 VITALS — BP 140/81 | HR 65 | Temp 97.5°F | Resp 18

## 2020-11-27 DIAGNOSIS — J069 Acute upper respiratory infection, unspecified: Secondary | ICD-10-CM | POA: Insufficient documentation

## 2020-11-27 LAB — POCT URINALYSIS DIP (MANUAL ENTRY)
Bilirubin, UA: NEGATIVE
Blood, UA: NEGATIVE
Glucose, UA: NEGATIVE mg/dL
Ketones, POC UA: NEGATIVE mg/dL
Leukocytes, UA: NEGATIVE
Nitrite, UA: NEGATIVE
Protein Ur, POC: NEGATIVE mg/dL
Spec Grav, UA: 1.02 (ref 1.010–1.025)
Urobilinogen, UA: 0.2 E.U./dL
pH, UA: 7 (ref 5.0–8.0)

## 2020-11-27 MED ORDER — CETIRIZINE HCL 10 MG PO TABS: 10.0000 mg | ORAL_TABLET | Freq: Every day | ORAL | 0 refills | Status: DC

## 2020-11-27 MED ORDER — KETOROLAC TROMETHAMINE 60 MG/2ML IM SOLN
60.0000 mg | Freq: Once | INTRAMUSCULAR | Status: AC
  Administered 2020-11-27: 60 mg via INTRAMUSCULAR

## 2020-11-27 NOTE — ED Provider Notes (Signed)
[facility]    CSN: [id number] Arrival date & time: 11/27/20  1653      History   Chief Complaint Chief Complaint  Patient presents with   Headache   Nasal Congestion    HPI Mary Harrington is a 35 y.o. female.   Patient presents with 2 to 3-day history of nasal congestion and left ear discomfort.  Has taken her daily Singulair, Flonase, saline spray and Tylenol without relief of symptoms. Has left-sided headache that started yesterday.  Has history of migraines and took her as needed migraine medication that she cannot remember the name of, but it did not resolve headache.  Has also taken over-the-counter ibuprofen with no relief of headache.  Denies fever, nausea, vomiting, abdominal pain, diarrhea.  Denies any sick contacts.  Having some bilateral lower back pain but denies urinary burning.  Endorses some urinary hesitancy.  Denies vaginal discharge or irritation.   Headache  Past Medical History:  Diagnosis Date   Anginal pain ([place])    within the last 3 months; occurs center of the chest    Bradycardia    hx of syncope in association ; per patient "i havent fell out in like a year but sometimes i feel like i am about to"    Menorrhagia    Migraines     Patient Active Problem List   Diagnosis Date Noted   Transaminitis 09/22/2019   Dyspepsia 09/22/2019   Flexural eczema 07/06/2019   Recurrent major depressive disorder, in partial remission ([place]) 07/06/2019   Grief 05/17/2019   H/O sickle cell trait 08/25/2017   Adjustment disorder with mixed anxiety and depressed mood 08/25/2017   Menorrhagia 01/12/2017   Allergic rhinitis 11/13/2016   Tinnitus aurium, left 11/13/2016   Headache 11/13/2016   Class 2 obesity without serious comorbidity with body mass index (BMI) of 36.0 to 36.9 in adult 08/08/2016   Vitamin D deficiency 08/08/2016   Fatigue 03/13/2014   Depressed mood 03/13/2014   Breast lump on right side at 2 o'clock position 03/15/2013    Past  Surgical History:  Procedure Laterality Date   BREAST BIOPSY Right 2015   LAPAROSCOPIC VAGINAL HYSTERECTOMY WITH SALPINGECTOMY Bilateral 01/12/2017   Procedure: LAPAROSCOPIC ASSISTED VAGINAL HYSTERECTOMY WITH bilateral SALPINGECTOMY and ablation of endometriosis;  Surgeon: Everlene Farrier, MD;  Location: [facility];  Service: Gynecology;  Laterality: Bilateral;   TUBAL LIGATION      OB History     Gravida  2   Para  2   Term  2   Preterm      AB      Living  2      SAB      IAB      Ectopic      Multiple      Live Births               Home Medications    Prior to Admission medications   Medication Sig Start Date End Date Taking? Authorizing Provider  cetirizine (ALLERGY, CETIRIZINE,) 10 MG tablet Take 1 tablet (10 mg total) by mouth daily. 11/27/20  Yes Odis Luster, FNP  montelukast (SINGULAIR) 10 MG tablet montelukast 10 mg tablet 08/03/20  Yes [provider]  albuterol (VENTOLIN HFA) 108 (90 Base) MCG/ACT inhaler Inhale 1 puff into the lungs every 6 (six) hours as needed for wheezing or shortness of breath. 05/02/19   Nche, Charlene Brooke, NP  Emollient (CERAVE SA RENEWING) CREA Apply 1 application  topically 3 (three) times daily. 07/06/19   Nche, Charlene Brooke, NP  fluticasone (FLONASE) 50 MCG/ACT nasal spray fluticasone propionate 50 mcg/actuation nasal spray,suspension  SHAKE LQ AND U 2 SPRAYS IEN D    [provider]  rizatriptan (MAXALT) 5 MG tablet Take 1 tablet (5 mg total) by mouth as needed for migraine. May repeat in 2 hours if needed. No more than 4tab in 24hrs 09/21/19   Nche, Charlene Brooke, NP  topiramate (TOPAMAX) 25 MG tablet Take 1 tablet (25 mg total) by mouth 2 (two) times daily. 09/22/19   Nche, Charlene Brooke, NP  triamcinolone ointment (KENALOG) 0.5 % Apply 1 application topically 2 (two) times daily. 07/06/19   Nche, Charlene Brooke, NP  Vitamin D, Ergocalciferol, (DRISDOL) 1.25 MG (50000 UNIT) CAPS capsule Take  50,000 Units by mouth once a week. 11/02/20   [provider]    Family History Family History  Problem Relation Age of Onset   Hypertension Mother    Lung cancer Father 58       melanoma lung cancer   Heart failure Father    Lung cancer Brother    Cervical cancer Paternal Grandmother 23   Sickle cell anemia Paternal Uncle    Bipolar disorder Maternal Grandfather    Schizophrenia Maternal Grandfather    Bipolar disorder Cousin    Schizophrenia Cousin     Social History Social History   Tobacco Use   Smoking status: Former    Packs/day: 0.25    Years: 9.00    Pack years: 2.25    Types: Cigarettes    Quit date: 06/16/2017    Years since quitting: 3.4   Smokeless tobacco: Never   Tobacco comments:    about 7 cigarettes  a day   Substance Use Topics   Alcohol use: Yes    Comment: Occasional   Drug use: No     Allergies   Patient has no known allergies.   Review of Systems Review of Systems  Neurological:  Positive for headaches.  Per HPI  Physical Exam Triage Vital Signs ED Triage Vitals  Enc Vitals Group     BP 11/27/20 1706 140/81     Pulse Rate 11/27/20 1706 65     Resp 11/27/20 1706 18     Temp 11/27/20 1706 (!) 97.5 F (36.4 C)     Temp Source 11/27/20 1706 Oral     SpO2 11/27/20 1706 98 %     Weight --      Height --      Head Circumference --      Peak Flow --      Pain Score 11/27/20 1709 10     Pain Loc --      Pain Edu? --      Excl. in [place]? --    No data found.  Updated Vital Signs BP 140/81 (BP Location: Left Arm)   Pulse 65   Temp (!) 97.5 F (36.4 C) (Oral)   Resp 18   LMP 01/01/2017 (Exact Date)   SpO2 98%   Visual Acuity Right Eye Distance:   Left Eye Distance:   Bilateral Distance:    Right Eye Near:   Left Eye Near:    Bilateral Near:     Physical Exam Constitutional:      General: She is not in acute distress.    Appearance: Normal appearance.  HENT:     Head: Normocephalic and atraumatic.     Right  Ear: Hearing  and ear canal normal. No decreased hearing noted. No tenderness. A middle ear effusion is present. Tympanic membrane is not erythematous or bulging.     Left Ear: Hearing and ear canal normal. No decreased hearing noted. No tenderness. A middle ear effusion is present. Tympanic membrane is not erythematous or bulging.     Nose: Mucosal edema and congestion present. No rhinorrhea.     Mouth/Throat:     Mouth: Mucous membranes are moist.     Pharynx: No posterior oropharyngeal erythema.  Eyes:     Extraocular Movements: Extraocular movements intact.     Conjunctiva/sclera: Conjunctivae normal.     Pupils: Pupils are equal, round, and reactive to light.  Cardiovascular:     Rate and Rhythm: Normal rate and regular rhythm.     Pulses: Normal pulses.     Heart sounds: Normal heart sounds.  Pulmonary:     Effort: Pulmonary effort is normal. No respiratory distress.     Breath sounds: Normal breath sounds. No wheezing.  Abdominal:     General: Abdomen is flat. Bowel sounds are normal.     Palpations: Abdomen is soft.  Musculoskeletal:        General: Normal range of motion.     Cervical back: Normal range of motion.  Skin:    General: Skin is warm and dry.  Neurological:     General: No focal deficit present.     Mental Status: She is alert and oriented to person, place, and time. Mental status is at baseline.     Cranial Nerves: Cranial nerves are intact.     Sensory: Sensation is intact.     Motor: Motor function is intact.     Coordination: Coordination is intact.     Comments: Neuro exam normal.  Psychiatric:        Mood and Affect: Mood normal.        Behavior: Behavior normal.     UC Treatments / Results  Labs (all labs ordered are listed, but only abnormal results are displayed) Labs Reviewed  POCT URINALYSIS DIP (MANUAL ENTRY) - Abnormal; Notable for the following components:      Result Value   Clarity, UA cloudy (*)    All other components within normal  limits  NOVEL CORONAVIRUS, NAA  URINE CULTURE    EKG   Radiology No results found.  Procedures Procedures (including critical care time)  Medications Ordered in UC Medications  ketorolac (TORADOL) injection 60 mg (60 mg Intramuscular Given 11/27/20 1746)    Initial Impression / Assessment and Plan / UC Course  I have reviewed the triage vital signs and the nursing notes.  Pertinent labs & imaging results that were available during my care of the patient were reviewed by me and considered in my medical decision making (see chart for details).     Patient presents with symptoms likely from a viral upper respiratory infection. Differential includes bacterial pneumonia, sinusitis, allergic rhinitis, covid 19. Do not suspect underlying cardiopulmonary process. Symptoms seem unlikely related to ACS, CHF or COPD exacerbations, pneumonia, pneumothorax. Patient is nontoxic appearing and not in need of emergent medical intervention.  COVID-19 viral swab pending.  Recommended symptom control with over the counter medications. Daily oral anti-histamine prescribed. May take oral decongestant or IN corticosteroid, saline irrigations, cepacol lozenges, Robitussin, Delsym, honey tea.  Return if symptoms fail to improve in 1-2 weeks or you develop shortness of breath, chest pain, severe headache. Patient states understanding and is agreeable.  Urinalysis  negative for UTI.  Urine culture pending.  Discharged with PCP followup.  Final Clinical Impressions(s) / UC Diagnoses   Final diagnoses:  Viral upper respiratory infection     Discharge Instructions      You likely having a viral upper respiratory infection. We recommended symptom control. I expect your symptoms to start improving in the next 1-2 weeks.   1. You have been prescribed cetirizine. Please take daily until symptoms resolve.   2. For congestion you may try an oral decongestant like Mucinex or sudafed. You may also try  intranasal flonase nasal spray or saline irrigations (neti pot, sinus cleanse)  3. For your sore throat you may try cepacol lozenges, salt water gargles, throat spray. Treatment of congestion may also help your sore throat.  4. For cough you may try Robitussen, Mucinex DM  5. Take Tylenol or Ibuprofen to help with pain/inflammation  6. Stay hydrated, drink plenty of fluids to keep throat coated and less irritated  Honey Tea For cough/sore throat try using a honey-based tea. Use 3 teaspoons of honey with juice squeezed from half lemon. Place shaved pieces of ginger into 1/2-1 cup of water and warm over stove top. Then mix the ingredients and repeat every 4 hours as needed.   Covid 19 viral swab is pending.  We will call if it is positive.    Your urine test was negative for infection.  Urine culture is pending.  We will call if it is positive.  You received ketorolac injection in urgent care today for headache.  Please avoid any ibuprofen, Advil, Aleve for 24 hours following injection.  May take Tylenol as needed.  Please go to the hospital if headache worsens or does not change in the next 24 hours.      ED Prescriptions     Medication Sig Dispense Auth. Provider   cetirizine (ALLERGY, CETIRIZINE,) 10 MG tablet Take 1 tablet (10 mg total) by mouth daily. 30 tablet Odis Luster, FNP      PDMP not reviewed this encounter.   Odis Luster, FNP 11/27/20 1756

## 2020-11-27 NOTE — ED Triage Notes (Signed)
Two days nasal congestion, low back pain, left ear pain and onset yesterday of HA. Has been taking Singulair, Flonase, saline spray and tylenol without relief. Confirms nausea. Denies cough, right ear pain, v/d.

## 2020-11-27 NOTE — Discharge Instructions (Addendum)
You likely having a viral upper respiratory infection. We recommended symptom control. I expect your symptoms to start improving in the next 1-2 weeks.   1. You have been prescribed cetirizine. Please take daily until symptoms resolve.   2. For congestion you may try an oral decongestant like Mucinex or sudafed. You may also try intranasal flonase nasal spray or saline irrigations (neti pot, sinus cleanse)  3. For your sore throat you may try cepacol lozenges, salt water gargles, throat spray. Treatment of congestion may also help your sore throat.  4. For cough you may try Robitussen, Mucinex DM  5. Take Tylenol or Ibuprofen to help with pain/inflammation  6. Stay hydrated, drink plenty of fluids to keep throat coated and less irritated  Honey Tea For cough/sore throat try using a honey-based tea. Use 3 teaspoons of honey with juice squeezed from half lemon. Place shaved pieces of ginger into 1/2-1 cup of water and warm over stove top. Then mix the ingredients and repeat every 4 hours as needed.   Covid 19 viral swab is pending.  We will call if it is positive.    Your urine test was negative for infection.  Urine culture is pending.  We will call if it is positive.  You received ketorolac injection in urgent care today for headache.  Please avoid any ibuprofen, Advil, Aleve for 24 hours following injection.  May take Tylenol as needed.  Please go to the hospital if headache worsens or does not change in the next 24 hours.

## 2020-11-29 LAB — NOVEL CORONAVIRUS, NAA: SARS-CoV-2, NAA: NOT DETECTED

## 2020-11-29 LAB — URINE CULTURE: Culture: NO GROWTH

## 2020-11-29 LAB — SARS-COV-2, NAA 2 DAY TAT

## 2020-11-30 ENCOUNTER — Other Ambulatory Visit: Payer: Self-pay

## 2020-11-30 ENCOUNTER — Ambulatory Visit
Admission: RE | Admit: 2020-11-30 | Discharge: 2020-11-30 | Disposition: A | Discharge status: Home / Self Care | Payer: Medicaid Other | Source: Ambulatory Visit | Attending: Emergency Medicine | Admitting: Emergency Medicine

## 2020-11-30 VITALS — BP 125/82 | HR 71 | Temp 97.8°F | Resp 20

## 2020-11-30 DIAGNOSIS — R600 Localized edema: Secondary | ICD-10-CM | POA: Diagnosis not present

## 2020-11-30 DIAGNOSIS — R1012 Left upper quadrant pain: Secondary | ICD-10-CM | POA: Diagnosis not present

## 2020-11-30 DIAGNOSIS — J069 Acute upper respiratory infection, unspecified: Secondary | ICD-10-CM | POA: Insufficient documentation

## 2020-11-30 DIAGNOSIS — R3911 Hesitancy of micturition: Secondary | ICD-10-CM | POA: Diagnosis not present

## 2020-11-30 LAB — POCT URINALYSIS DIP (MANUAL ENTRY)
Bilirubin, UA: NEGATIVE
Blood, UA: NEGATIVE
Glucose, UA: NEGATIVE mg/dL
Ketones, POC UA: NEGATIVE mg/dL
Leukocytes, UA: NEGATIVE
Nitrite, UA: NEGATIVE
Protein Ur, POC: NEGATIVE mg/dL
Spec Grav, UA: 1.02 (ref 1.010–1.025)
Urobilinogen, UA: 0.2 E.U./dL
pH, UA: 7 (ref 5.0–8.0)

## 2020-11-30 NOTE — Discharge Instructions (Addendum)
Please go to the hospital today for further evaluation and management.

## 2020-11-30 NOTE — ED Triage Notes (Signed)
Pt was last seen on 7/5 for nasal congestion, low back pain and HA. Today, she reports that sxs are not improving. She describes her HA as "nagging". New onset 2 days ago of fluid retention on bilateral LE. Pt also c/o abdominal pain underneath her breast area. Denies nausea and emesis. Pt has a h/o  Non alcoholic fatty liver disease and was advised by her PCP to come to UC for lab work. Onset this week of urinary sxs. She denies frequency, hematuria, dysuria and urgency. Pt describes sxs as feeling like she needs to relieve herself and then having to wait approx 10-15 minutes before she is able to get a stream.

## 2020-11-30 NOTE — ED Provider Notes (Signed)
[facility]    CSN: [id number] Arrival date & time: 11/30/20  1229      History   Chief Complaint Chief Complaint  Patient presents with   appointment at 1pm   Leg Swelling    BLE    Abdominal Pain    HPI Mary Harrington is a 35 y.o. female.   Patient presents for follow-up for upper respiratory infection that she was seen for on 11/27/2020.  Patient states that she took Mucinex, cetirizine, and other over-the-counter medications that were suggested at previous visit but symptoms have not improved or resolved.  Patient was given ketorolac injection in urgent care at previous visit for headache.  Patient states that headache has improved but has not completely resolved since last visit.  Patient does have a history of migraines.  Patient also states that she noticed that she has some bilateral lower extremity edema that started approximately 2 days ago.  Denies any pain in her legs.  Patient also also now having urinary hesitancy and states that it is difficult to start her urine stream even after sitting on the toilet for approximately 10 to 15 minutes.  Denies any urinary burning or frequency.  Does have some low back pain.  Urinalysis and urine culture were negative at previous visit 3 days ago.  Also having some left upper quadrant pain.  Pain is a burning sensation and is intermittent.  Denies any nausea, vomiting, diarrhea.  Bowel movements are normal and patient denies constipation.  Patient states that she does have a history of nonalcoholic fatty liver disease.  This was being managed with diet at this time and was being followed closely.  Denies any cardiac history or kidney history.   Abdominal Pain  Past Medical History:  Diagnosis Date   Anginal pain ([place])    within the last 3 months; occurs center of the chest    Bradycardia    hx of syncope in association ; per patient "i havent fell out in like a year but sometimes i feel like i am about to"    Menorrhagia     Migraines     Patient Active Problem List   Diagnosis Date Noted   Transaminitis 09/22/2019   Dyspepsia 09/22/2019   Flexural eczema 07/06/2019   Recurrent major depressive disorder, in partial remission ([place]) 07/06/2019   Grief 05/17/2019   H/O sickle cell trait 08/25/2017   Adjustment disorder with mixed anxiety and depressed mood 08/25/2017   Menorrhagia 01/12/2017   Allergic rhinitis 11/13/2016   Tinnitus aurium, left 11/13/2016   Headache 11/13/2016   Class 2 obesity without serious comorbidity with body mass index (BMI) of 36.0 to 36.9 in adult 08/08/2016   Vitamin D deficiency 08/08/2016   Fatigue 03/13/2014   Depressed mood 03/13/2014   Breast lump on right side at 2 o'clock position 03/15/2013    Past Surgical History:  Procedure Laterality Date   BREAST BIOPSY Right 2015   LAPAROSCOPIC VAGINAL HYSTERECTOMY WITH SALPINGECTOMY Bilateral 01/12/2017   Procedure: LAPAROSCOPIC ASSISTED VAGINAL HYSTERECTOMY WITH bilateral SALPINGECTOMY and ablation of endometriosis;  Surgeon: Everlene Farrier, MD;  Location: [facility];  Service: Gynecology;  Laterality: Bilateral;   TUBAL LIGATION      OB History     Gravida  2   Para  2   Term  2   Preterm      AB      Living  2      SAB  IAB      Ectopic      Multiple      Live Births               Home Medications    Prior to Admission medications   Medication Sig Start Date End Date Taking? Authorizing Provider  albuterol (VENTOLIN HFA) 108 (90 Base) MCG/ACT inhaler Inhale 1 puff into the lungs every 6 (six) hours as needed for wheezing or shortness of breath. 05/02/19   Nche, Charlene Brooke, NP  cetirizine (ALLERGY, CETIRIZINE,) 10 MG tablet Take 1 tablet (10 mg total) by mouth daily. 11/27/20   Odis Luster, FNP  Emollient (CERAVE SA RENEWING) CREA Apply 1 application topically 3 (three) times daily. 07/06/19   Nche, Charlene Brooke, NP  fluticasone (FLONASE) 50 MCG/ACT nasal spray  fluticasone propionate 50 mcg/actuation nasal spray,suspension  SHAKE LQ AND U 2 SPRAYS IEN D    [provider]  montelukast (SINGULAIR) 10 MG tablet montelukast 10 mg tablet 08/03/20   [provider]  rizatriptan (MAXALT) 5 MG tablet Take 1 tablet (5 mg total) by mouth as needed for migraine. May repeat in 2 hours if needed. No more than 4tab in 24hrs 09/21/19   Nche, Charlene Brooke, NP  topiramate (TOPAMAX) 25 MG tablet Take 1 tablet (25 mg total) by mouth 2 (two) times daily. 09/22/19   Nche, Charlene Brooke, NP  triamcinolone ointment (KENALOG) 0.5 % Apply 1 application topically 2 (two) times daily. 07/06/19   Nche, Charlene Brooke, NP  Vitamin D, Ergocalciferol, (DRISDOL) 1.25 MG (50000 UNIT) CAPS capsule Take 50,000 Units by mouth once a week. 11/02/20   [provider]    Family History Family History  Problem Relation Age of Onset   Hypertension Mother    Lung cancer Father 74       melanoma lung cancer   Heart failure Father    Lung cancer Brother    Cervical cancer Paternal Grandmother 79   Sickle cell anemia Paternal Uncle    Bipolar disorder Maternal Grandfather    Schizophrenia Maternal Grandfather    Bipolar disorder Cousin    Schizophrenia Cousin     Social History Social History   Tobacco Use   Smoking status: Former    Packs/day: 0.25    Years: 9.00    Pack years: 2.25    Types: Cigarettes    Quit date: 06/16/2017    Years since quitting: 3.4   Smokeless tobacco: Never   Tobacco comments:    about 7 cigarettes  a day   Substance Use Topics   Alcohol use: Yes    Comment: Occasional   Drug use: No     Allergies   Patient has no known allergies.   Review of Systems Review of Systems Per HPI  Physical Exam Triage Vital Signs ED Triage Vitals  Enc Vitals Group     BP 11/30/20 1316 125/82     Pulse Rate 11/30/20 1316 71     Resp 11/30/20 1316 20     Temp 11/30/20 1316 97.8 F (36.6 C)     Temp Source 11/30/20 1316 Oral      SpO2 11/30/20 1316 98 %     Weight --      Height --      Head Circumference --      Peak Flow --      Pain Score 11/30/20 1324 5     Pain Loc --      Pain  Edu? --      Excl. in [place]? --    No data found.  Updated Vital Signs BP 125/82 (BP Location: Left Arm)   Pulse 71   Temp 97.8 F (36.6 C) (Oral)   Resp 20   LMP 01/01/2017 (Exact Date)   SpO2 98%   Visual Acuity Right Eye Distance:   Left Eye Distance:   Bilateral Distance:    Right Eye Near:   Left Eye Near:    Bilateral Near:     Physical Exam Constitutional:      General: She is not in acute distress.    Appearance: Normal appearance.  HENT:     Head: Normocephalic and atraumatic.     Right Ear: Tympanic membrane and ear canal normal.     Left Ear: Tympanic membrane and ear canal normal.     Nose: Mucosal edema and congestion present.     Mouth/Throat:     Pharynx: No posterior oropharyngeal erythema.  Eyes:     Extraocular Movements: Extraocular movements intact.     Conjunctiva/sclera: Conjunctivae normal.  Cardiovascular:     Rate and Rhythm: Normal rate and regular rhythm.     Pulses: Normal pulses.     Heart sounds: Normal heart sounds.  Pulmonary:     Effort: Pulmonary effort is normal.     Breath sounds: Normal breath sounds.  Abdominal:     General: Abdomen is flat. Bowel sounds are normal. There is no distension.     Palpations: Abdomen is soft.     Tenderness: There is no abdominal tenderness. There is no guarding. Negative signs include Murphy's sign and McBurney's sign.     Hernia: No hernia is present.  Skin:    General: Skin is warm and dry.     Comments: Nonpitting edema present to bilateral lower extremities that extends from feet to mid shin.  Bilateral hands are also swollen with nonpitting edema.  Neurological:     General: No focal deficit present.     Mental Status: She is alert and oriented to person, place, and time. Mental status is at baseline.  Psychiatric:        Mood and  Affect: Mood normal.        Behavior: Behavior normal.        Thought Content: Thought content normal.        Judgment: Judgment normal.     UC Treatments / Results  Labs (all labs ordered are listed, but only abnormal results are displayed) Labs Reviewed  URINE CULTURE  POCT URINALYSIS DIP (MANUAL ENTRY)    EKG   Radiology No results found.  Procedures Procedures (including critical care time)  Medications Ordered in UC Medications - No data to display  Initial Impression / Assessment and Plan / UC Course  I have reviewed the triage vital signs and the nursing notes.  Pertinent labs & imaging results that were available during my care of the patient were reviewed by me and considered in my medical decision making (see chart for details).     Due to worrisome clinical symptoms and physical exam, patient was advised to go to the hospital for further evaluation and management.  There is concern for possible kidney involvement due to urinary hesitancy/retention with extremity swelling.  Abdominal pain is also worrisome given patient's history. Patient voiced understanding and was agreeable to going to the hospital to be further evaluated.  Final Clinical Impressions(s) / UC Diagnoses   Final diagnoses:  Acute  upper respiratory infection  Lower extremity edema  Abdominal pain, left upper quadrant  Urinary hesitancy     Discharge Instructions      Please go to the hospital today for further evaluation and management.     ED Prescriptions   None    PDMP not reviewed this encounter.   Odis Luster, FNP 11/30/20 [phone]

## 2020-12-01 ENCOUNTER — Emergency Department (HOSPITAL_BASED_OUTPATIENT_CLINIC_OR_DEPARTMENT_OTHER)
Admission: EM | Admit: 2020-12-01 | Discharge: 2020-12-01 | Disposition: A | Discharge status: Home / Self Care | Payer: Medicaid Other | Attending: Emergency Medicine | Admitting: Emergency Medicine

## 2020-12-01 ENCOUNTER — Emergency Department (HOSPITAL_BASED_OUTPATIENT_CLINIC_OR_DEPARTMENT_OTHER): Payer: Medicaid Other | Admitting: Radiology

## 2020-12-01 ENCOUNTER — Encounter (HOSPITAL_BASED_OUTPATIENT_CLINIC_OR_DEPARTMENT_OTHER): Payer: Self-pay

## 2020-12-01 DIAGNOSIS — R609 Edema, unspecified: Secondary | ICD-10-CM

## 2020-12-01 DIAGNOSIS — I509 Heart failure, unspecified: Secondary | ICD-10-CM | POA: Diagnosis not present

## 2020-12-01 DIAGNOSIS — R2243 Localized swelling, mass and lump, lower limb, bilateral: Secondary | ICD-10-CM | POA: Diagnosis present

## 2020-12-01 DIAGNOSIS — R109 Unspecified abdominal pain: Secondary | ICD-10-CM | POA: Diagnosis not present

## 2020-12-01 DIAGNOSIS — Z7952 Long term (current) use of systemic steroids: Secondary | ICD-10-CM | POA: Diagnosis not present

## 2020-12-01 DIAGNOSIS — Z87891 Personal history of nicotine dependence: Secondary | ICD-10-CM | POA: Diagnosis not present

## 2020-12-01 LAB — CBC WITH DIFFERENTIAL/PLATELET
Abs Immature Granulocytes: 0.01 10*3/uL (ref 0.00–0.07)
Basophils Absolute: 0 10*3/uL (ref 0.0–0.1)
Basophils Relative: 1 %
Eosinophils Absolute: 0.1 10*3/uL (ref 0.0–0.5)
Eosinophils Relative: 3 %
HCT: 37.8 % (ref 36.0–46.0)
Hemoglobin: 12.2 g/dL (ref 12.0–15.0)
Immature Granulocytes: 0 %
Lymphocytes Relative: 42 %
Lymphs Abs: 1.9 10*3/uL (ref 0.7–4.0)
MCH: 27.1 pg (ref 26.0–34.0)
MCHC: 32.3 g/dL (ref 30.0–36.0)
MCV: 84 fL (ref 80.0–100.0)
Monocytes Absolute: 0.4 10*3/uL (ref 0.1–1.0)
Monocytes Relative: 9 %
Neutro Abs: 2 10*3/uL (ref 1.7–7.7)
Neutrophils Relative %: 45 %
Platelets: 242 10*3/uL (ref 150–400)
RBC: 4.5 MIL/uL (ref 3.87–5.11)
RDW: 13.3 % (ref 11.5–15.5)
WBC: 4.4 10*3/uL (ref 4.0–10.5)
nRBC: 0 % (ref 0.0–0.2)

## 2020-12-01 LAB — URINALYSIS, ROUTINE W REFLEX MICROSCOPIC
Bilirubin Urine: NEGATIVE
Glucose, UA: NEGATIVE mg/dL
Hgb urine dipstick: NEGATIVE
Ketones, ur: NEGATIVE mg/dL
Leukocytes,Ua: NEGATIVE
Nitrite: NEGATIVE
Protein, ur: NEGATIVE mg/dL
Specific Gravity, Urine: 1.019 (ref 1.005–1.030)
pH: 5.5 (ref 5.0–8.0)

## 2020-12-01 LAB — COMPREHENSIVE METABOLIC PANEL
ALT: 16 U/L (ref 0–44)
AST: 13 U/L — ABNORMAL LOW (ref 15–41)
Albumin: 3.7 g/dL (ref 3.5–5.0)
Alkaline Phosphatase: 60 U/L (ref 38–126)
Anion gap: 6 (ref 5–15)
BUN: 10 mg/dL (ref 6–20)
CO2: 27 mmol/L (ref 22–32)
Calcium: 8.9 mg/dL (ref 8.9–10.3)
Chloride: 107 mmol/L (ref 98–111)
Creatinine, Ser: 0.85 mg/dL (ref 0.44–1.00)
GFR, Estimated: >60 mL/min (ref >60)
Glucose, Bld: 92 mg/dL (ref 70–99)
Potassium: 4 mmol/L (ref 3.5–5.1)
Sodium: 140 mmol/L (ref 135–145)
Total Bilirubin: 0.2 mg/dL — ABNORMAL LOW (ref 0.3–1.2)
Total Protein: 6 g/dL — ABNORMAL LOW (ref 6.5–8.1)

## 2020-12-01 LAB — BRAIN NATRIURETIC PEPTIDE: B Natriuretic Peptide: 70.2 pg/mL (ref 0.0–100.0)

## 2020-12-01 MED ORDER — FUROSEMIDE 20 MG PO TABS: 20.0000 mg | ORAL_TABLET | Freq: Every day | ORAL | 0 refills | Status: AC | PRN

## 2020-12-01 NOTE — ED Notes (Signed)
Bladder scan showed 0 mL post void.

## 2020-12-01 NOTE — ED Provider Notes (Signed)
[facility] Provider Note   CSN: [id number] Arrival date & time: 12/01/20  0112     History Chief Complaint  Patient presents with   Leg Swelling    Mary Harrington is a 35 y.o. female.  Patient presents with  nonpainful swelling to her bilateral legs for the past 3 days.  States she was seen at urgent care on July 5 and diagnosed with a URI.  She went back to urgent care 2 days later and was told to come to the ED for her leg swelling to check her kidney and liver function.  The patient states her legs are improved since she elevated them earlier today.  She denies any pain in her legs.  No difficulty breathing or difficulty swallowing.  No chest pain, cough or fever.  Still urinating normal amounts.  States occasionally she gets cramps in her abdomen after eating but none currently.  No pain with urination or blood in the urine.  Feels like she is emptying her bladder completely.  Denies any previous history of liver or kidney disease other than "nonalcoholic liver disease".  Denies any history of heart or lung problems. No history of VTE.  No exogenous hormone use.  No recent travel or immobilization.       Past Medical History:  Diagnosis Date   Anginal pain ([place])    within the last 3 months; occurs center of the chest    Bradycardia    hx of syncope in association ; per patient "i havent fell out in like a year but sometimes i feel like i am about to"    Menorrhagia    Migraines     Patient Active Problem List   Diagnosis Date Noted   Transaminitis 09/22/2019   Dyspepsia 09/22/2019   Flexural eczema 07/06/2019   Recurrent major depressive disorder, in partial remission ([place]) 07/06/2019   Grief 05/17/2019   H/O sickle cell trait 08/25/2017   Adjustment disorder with mixed anxiety and depressed mood 08/25/2017   Menorrhagia 01/12/2017   Allergic rhinitis 11/13/2016   Tinnitus aurium, left 11/13/2016   Headache 11/13/2016   Class 2 obesity  without serious comorbidity with body mass index (BMI) of 36.0 to 36.9 in adult 08/08/2016   Vitamin D deficiency 08/08/2016   Fatigue 03/13/2014   Depressed mood 03/13/2014   Breast lump on right side at 2 o'clock position 03/15/2013    Past Surgical History:  Procedure Laterality Date   BREAST BIOPSY Right 2015   LAPAROSCOPIC VAGINAL HYSTERECTOMY WITH SALPINGECTOMY Bilateral 01/12/2017   Procedure: LAPAROSCOPIC ASSISTED VAGINAL HYSTERECTOMY WITH bilateral SALPINGECTOMY and ablation of endometriosis;  Surgeon: Everlene Farrier, MD;  Location: [facility];  Service: Gynecology;  Laterality: Bilateral;   TUBAL LIGATION       OB History     Gravida  2   Para  2   Term  2   Preterm      AB      Living  2      SAB      IAB      Ectopic      Multiple      Live Births              Family History  Problem Relation Age of Onset   Hypertension Mother    Lung cancer Father 70       melanoma lung cancer   Heart failure Father    Lung cancer Brother  Cervical cancer Paternal Grandmother 68   Sickle cell anemia Paternal Uncle    Bipolar disorder Maternal Grandfather    Schizophrenia Maternal Grandfather    Bipolar disorder Cousin    Schizophrenia Cousin     Social History   Tobacco Use   Smoking status: Former    Packs/day: 0.25    Years: 9.00    Pack years: 2.25    Types: Cigarettes    Quit date: 06/16/2017    Years since quitting: 3.4   Smokeless tobacco: Never   Tobacco comments:    about 7 cigarettes  a day   Substance Use Topics   Alcohol use: Yes    Comment: Occasional   Drug use: No    Home Medications Prior to Admission medications   Medication Sig Start Date End Date Taking? Authorizing Provider  albuterol (VENTOLIN HFA) 108 (90 Base) MCG/ACT inhaler Inhale 1 puff into the lungs every 6 (six) hours as needed for wheezing or shortness of breath. 05/02/19   Nche, Charlene Brooke, NP  cetirizine (ALLERGY, CETIRIZINE,) 10 MG tablet  Take 1 tablet (10 mg total) by mouth daily. 11/27/20   Odis Luster, FNP  Emollient (CERAVE SA RENEWING) CREA Apply 1 application topically 3 (three) times daily. 07/06/19   Nche, Charlene Brooke, NP  fluticasone (FLONASE) 50 MCG/ACT nasal spray fluticasone propionate 50 mcg/actuation nasal spray,suspension  SHAKE LQ AND U 2 SPRAYS IEN D    [provider]  montelukast (SINGULAIR) 10 MG tablet montelukast 10 mg tablet 08/03/20   [provider]  rizatriptan (MAXALT) 5 MG tablet Take 1 tablet (5 mg total) by mouth as needed for migraine. May repeat in 2 hours if needed. No more than 4tab in 24hrs 09/21/19   Nche, Charlene Brooke, NP  topiramate (TOPAMAX) 25 MG tablet Take 1 tablet (25 mg total) by mouth 2 (two) times daily. 09/22/19   Nche, Charlene Brooke, NP  triamcinolone ointment (KENALOG) 0.5 % Apply 1 application topically 2 (two) times daily. 07/06/19   Nche, Charlene Brooke, NP  Vitamin D, Ergocalciferol, (DRISDOL) 1.25 MG (50000 UNIT) CAPS capsule Take 50,000 Units by mouth once a week. 11/02/20   [provider]    Allergies    Patient has no known allergies.  Review of Systems   Review of Systems  Constitutional:  Positive for fatigue. Negative for activity change, appetite change and fever.  HENT:  Positive for congestion. Negative for rhinorrhea.   Respiratory:  Positive for cough.   Cardiovascular:  Positive for leg swelling.  Gastrointestinal:  Negative for abdominal pain, nausea and vomiting.  Genitourinary:  Positive for decreased urine volume. Negative for dysuria, flank pain and hematuria.  Musculoskeletal:  Negative for back pain and myalgias.  Skin:  Negative for rash.  Neurological:  Negative for weakness and headaches.   all other systems are negative except as noted in the HPI and PMH.   Physical Exam Updated Vital Signs BP 121/80 (BP Location: Right Arm)   Pulse 84   Temp 98.5 F (36.9 C) (Oral)   Resp 20   Ht 5\' 3"  (1.6 m)   Wt 92.7 kg   LMP  01/01/2017 (Exact Date)   SpO2 100%   BMI 36.19 kg/m   Physical Exam Vitals and nursing note reviewed.  Constitutional:      General: She is not in acute distress.    Appearance: She is well-developed.  HENT:     Head: Normocephalic and atraumatic.     Mouth/Throat:  Pharynx: No oropharyngeal exudate.  Eyes:     Conjunctiva/sclera: Conjunctivae normal.     Pupils: Pupils are equal, round, and reactive to light.  Neck:     Comments: No meningismus. Cardiovascular:     Rate and Rhythm: Normal rate and regular rhythm.     Heart sounds: Normal heart sounds. No murmur heard. Pulmonary:     Effort: Pulmonary effort is normal. No respiratory distress.     Breath sounds: Normal breath sounds.  Abdominal:     Palpations: Abdomen is soft.     Tenderness: There is no abdominal tenderness. There is no guarding or rebound.  Musculoskeletal:        General: No tenderness. Normal range of motion.     Cervical back: Normal range of motion and neck supple.     Right lower leg: Edema present.     Left lower leg: Edema present.     Comments: +1 edema to knees bilaterally.  Intact DP and PT pulses.  Calf soft and nontender.  No palpable cords Edema symmetric  Skin:    General: Skin is warm.  Neurological:     Mental Status: She is alert and oriented to person, place, and time.     Cranial Nerves: No cranial nerve deficit.     Motor: No abnormal muscle tone.     Coordination: Coordination normal.     Comments:  5/5 strength throughout. CN 2-12 intact.Equal grip strength.   Psychiatric:        Behavior: Behavior normal.    ED Results / Procedures / Treatments   Labs (all labs ordered are listed, but only abnormal results are displayed) Labs Reviewed  COMPREHENSIVE METABOLIC PANEL - Abnormal; Notable for the following components:      Result Value   Total Protein 6.0 (*)    AST 13 (*)    Total Bilirubin 0.2 (*)    All other components within normal limits  URINE CULTURE   URINALYSIS, ROUTINE W REFLEX MICROSCOPIC  CBC WITH DIFFERENTIAL/PLATELET  BRAIN NATRIURETIC PEPTIDE    EKG None  Radiology DG Chest 2 View  Result Date: 12/01/2020 CLINICAL DATA:  Shortness of breath, leg swelling EXAM: CHEST - 2 VIEW COMPARISON:  04/29/2019 FINDINGS: No consolidation, features of edema, pneumothorax, or effusion. Pulmonary vascularity is normally distributed. The cardiomediastinal contours are unremarkable. No acute osseous or soft tissue abnormality. IMPRESSION: No acute cardiopulmonary abnormality. Electronically Signed   By: Lovena Le M.D.   On: 12/01/2020 02:30    Procedures Procedures   Medications Ordered in ED Medications - No data to display  ED Course  I have reviewed the triage vital signs and the nursing notes.  Pertinent labs & imaging results that were available during my care of the patient were reviewed by me and considered in my medical decision making (see chart for details).    MDM Rules/Calculators/A&P                         Lower extremity edema with recent diagnosis of URI.  No pain.  Distal pulses intact.  Low suspicion for DVT given the symmetrical nature.  LFTs are normal, creatinine is normal, urinalysis has no protein. Serum protein mildly low.  Chest x-ray is negative.  Low suspicion for nephrotic syndrome, CHF, DVT or PE.  Suspect likely dependent peripheral edema.  Recommend elevation and compression stockings.  Will prescribe short course of furosemide as needed. Follow-up with PCP. Return precautions discussed Final  Clinical Impression(s) / ED Diagnoses Final diagnoses:  Peripheral edema    Rx / DC Orders ED Discharge Orders     None        Athelene Hursey, Annie Main, MD 12/01/20 [phone]

## 2020-12-01 NOTE — Discharge Instructions (Addendum)
Your liver function and kidney function are stable.  No evidence of heart failure.  Keep her legs elevated and use compression stockings.  Return to the ED with difficulty breathing, chest pain, any other concerns.

## 2020-12-01 NOTE — ED Triage Notes (Addendum)
Pt was seen at Urgent Care yesterday for a URI and swelling of the legs. She was told to follow up with her PCP but her PCP is out of town for the next three weeks and she is concerned of the swelling. Pt noticed the swelling of the feet and legs bilaterally two days ago. Denies SOB or CP.

## 2020-12-01 NOTE — ED Notes (Signed)
Pt verbalizes understanding of discharge instructions. Opportunity for questioning and answers were provided. Arm[place] removed by staff, pt discharged from ED to home. Educated to f/u with MD.

## 2020-12-02 LAB — URINE CULTURE
Culture: <10000 — AB
Culture: NO GROWTH

## 2020-12-03 ENCOUNTER — Telehealth: Payer: Self-pay

## 2020-12-03 NOTE — Telephone Encounter (Signed)
Transition Care Management Follow-up Telephone Call Date of discharge and from where: 12/01/2020- [facility] How have you been since you were released from the hospital? Doing Fine  Any questions or concerns? No  Items Reviewed: Did the pt receive and understand the discharge instructions provided? Yes  Medications obtained and verified? Yes  Other? No  Any new allergies since your discharge? No  Dietary orders reviewed? N/A Do you have support at home? Yes   Home Care and Equipment/Supplies: Were home health services ordered? not applicable If so, what is the name of the agency? N/A  Has the agency set up a time to come to the patient's home? not applicable Were any new equipment or medical supplies ordered?  No What is the name of the medical supply agency? N/A Were you able to get the supplies/equipment? not applicable Do you have any questions related to the use of the equipment or supplies? No  Functional Questionnaire: (I = Independent and D = Dependent) ADLs: I  Bathing/Dressing- I  Meal Prep- I  Eating- I  Maintaining continence- I  Transferring/Ambulation- I  Managing Meds- I  Follow up appointments reviewed:  PCP Hospital f/u appt confirmed? No  PCP won't be available until August per patient. [facility] f/u appt confirmed? No   Are transportation arrangements needed? No  If their condition worsens, is the pt aware to call PCP or go to the Emergency Dept.? Yes Was the patient provided with contact information for the PCP's office or ED? Yes Was to pt encouraged to call back with questions or concerns? Yes

## 2021-03-30 ENCOUNTER — Ambulatory Visit: Payer: Self-pay

## 2021-07-18 IMAGING — CT CT ABD-PELV W/ CM
2 of 4 series · 16 of 46 positions shown, 18 images · IV contrast (OMNIPAQUE 300)
Comparison: None.

CLINICAL DATA: Abdominal abscess/infection

EXAM:
CT ABDOMEN AND PELVIS WITH CONTRAST
TECHNIQUE: Multidetector CT imaging of the abdomen and pelvis was performed
using the standard protocol following bolus administration of
intravenous contrast.
CONTRAST:  100mL OMNIPAQUE IOHEXOL 300 MG/ML  SOLN

[Series 2: axial st · axial · 0.70mm/px · z∈[-433,-38]mm · 13 of 89 slices shown, 15 images]
[im 5/89  soft-tissue]
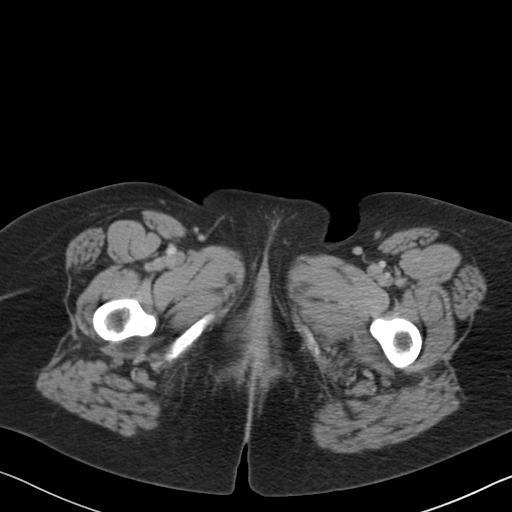
[im 5/89  bone]
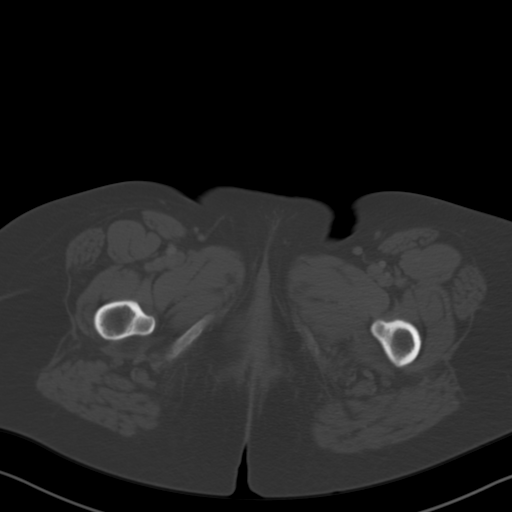
[im 10/89  soft-tissue]
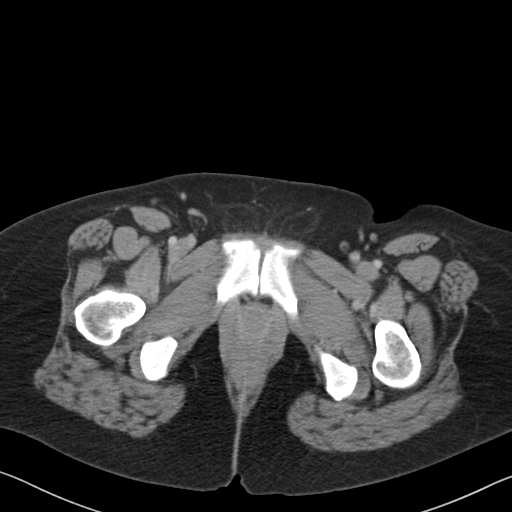
[im 20/89  soft-tissue]
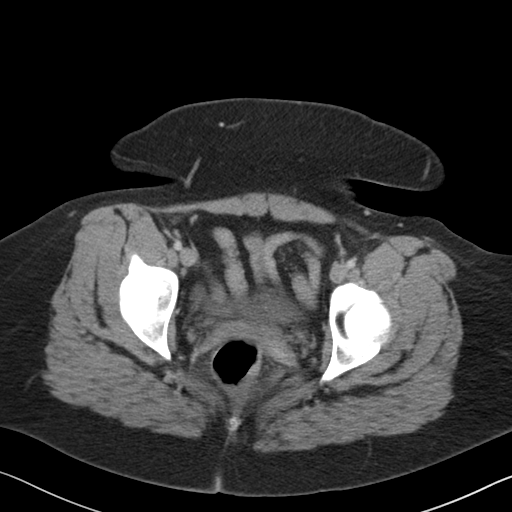
[im 25/89  soft-tissue]
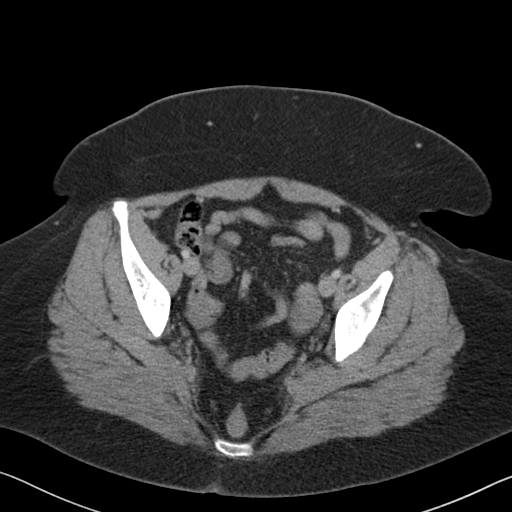
[im 30/89  soft-tissue]
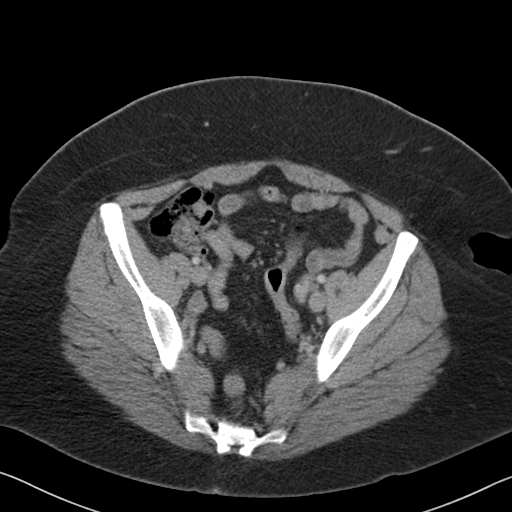
[im 40/89  soft-tissue]
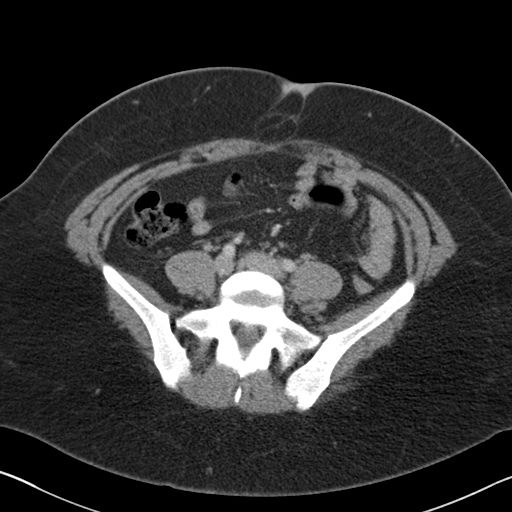
[im 45/89  soft-tissue]
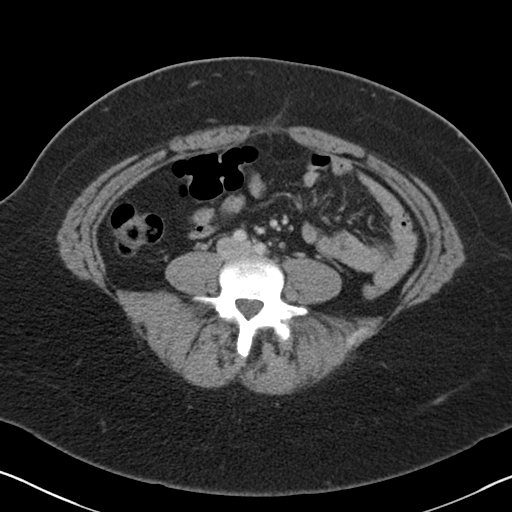
[im 49/89  soft-tissue]
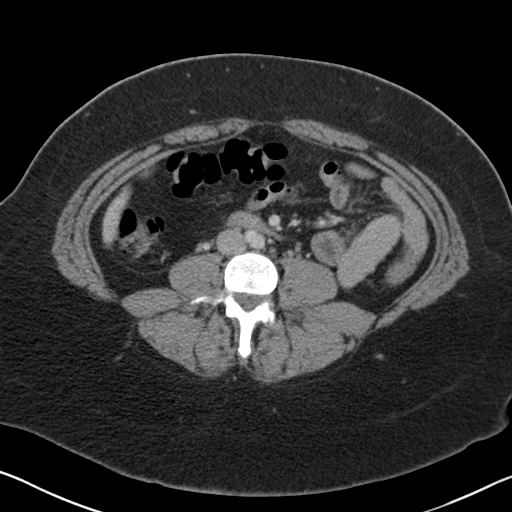
[im 59/89  soft-tissue]
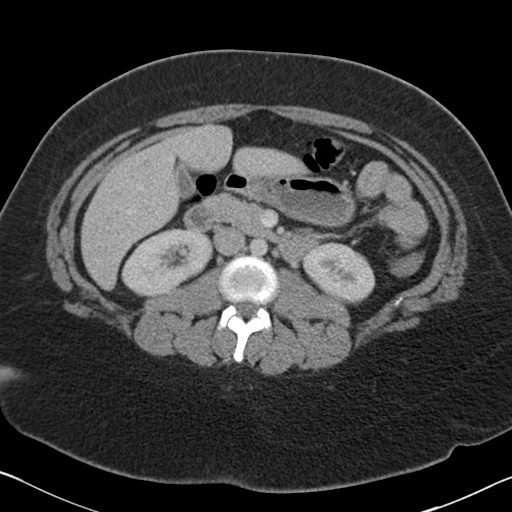
[im 59/89  bone]
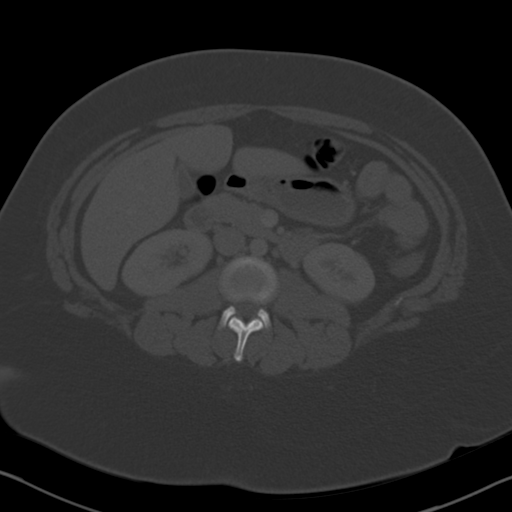
[im 64/89  soft-tissue]
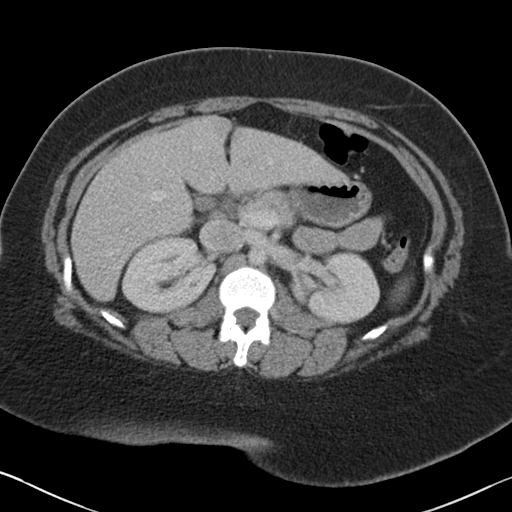
[im 69/89  soft-tissue]
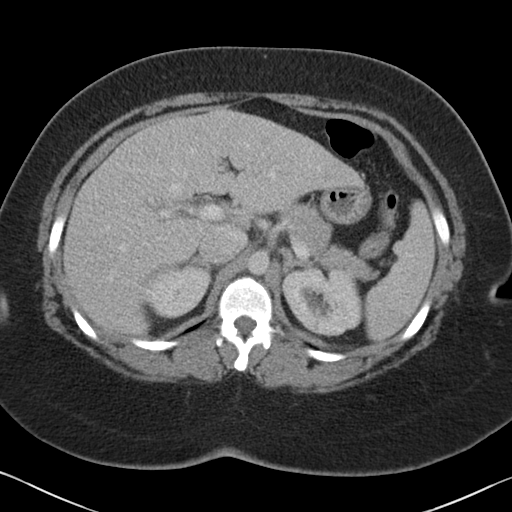
[im 79/89  soft-tissue]
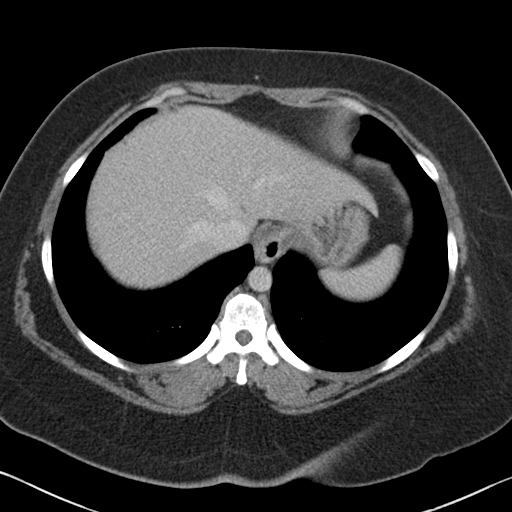
[im 84/89  soft-tissue]
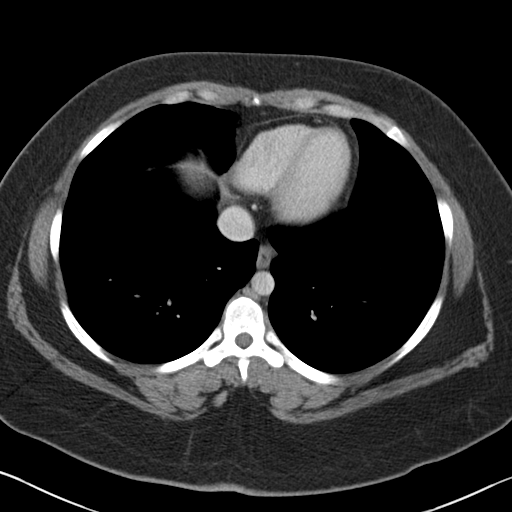

[Series 5: coronal st · coronal · 0.65mm/px · 3 of 125 slices shown]
[im 42/125  soft-tissue]
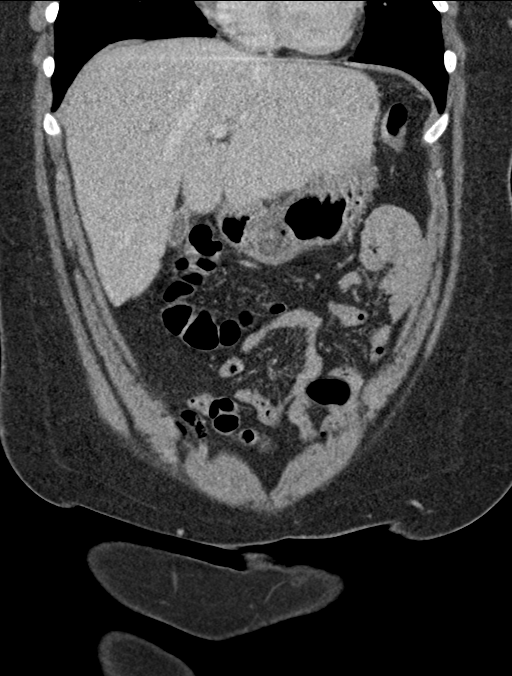
[im 56/125  soft-tissue]
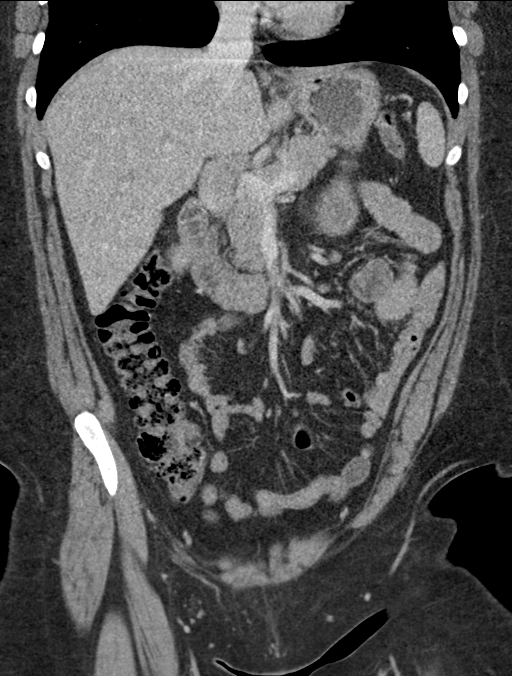
[im 69/125  soft-tissue]
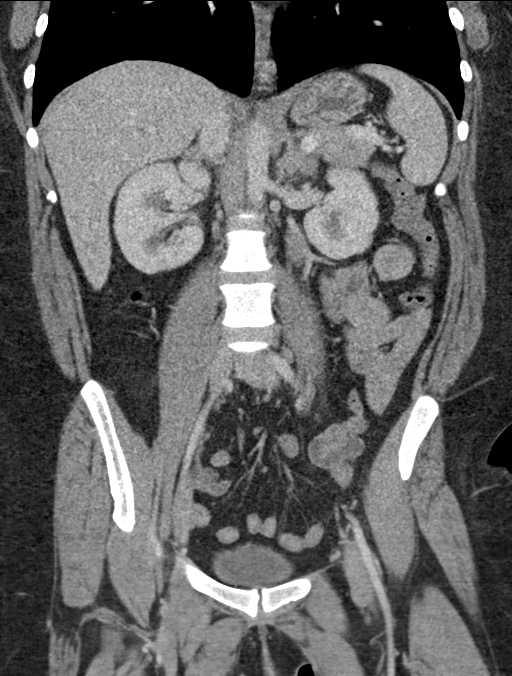

[16 of 46 positions shown; findings below may reference images not displayed]

FINDINGS: Lower chest: The visualized heart size within normal limits. No
pericardial fluid/thickening.

No hiatal hernia.

The visualized portions of the lungs are clear.

Hepatobiliary: A tiny hypodensity seen within the inferior right
liver lobe.The main portal vein is patent. No evidence of calcified
gallstones, gallbladder wall thickening or biliary dilatation.

Pancreas: Unremarkable. No pancreatic ductal dilatation or
surrounding inflammatory changes.

Spleen: Normal in size without focal abnormality.

Adrenals/Urinary Tract: Both adrenal glands appear normal. There is
a tiny 4 mm hypodense lesion within the upper pole of left kidney.
No urinary tract calculus or hydronephrosis. Bladder is
unremarkable.

Stomach/Bowel: The stomach, small bowel, are normal in appearance.
There is question of a focal segment of sigmoid colon with mild wall
thickening versus decompression, series 2, image 63. No surrounding
fat stranding changes however are noted. No loculated fluid
collections or free air. The appendix is unremarkable.

Vascular/Lymphatic: There are no enlarged mesenteric,
retroperitoneal, or pelvic lymph nodes. No significant vascular
findings are present.

Reproductive: The patient is status post hysterectomy. No adnexal
masses or collections seen.

Other: Fat containing anterior umbilical hernia noted.

Musculoskeletal: No acute or significant osseous findings.
IMPRESSION: Mild wall thickening of a focal segment of sigmoid colon could be
due to decompression versus true wall thickening from mild colitis.
No significant however surrounding inflammatory changes.

## 2022-01-27 ENCOUNTER — Ambulatory Visit
Admission: RE | Admit: 2022-01-27 | Discharge: 2022-01-27 | Disposition: A | Discharge status: Home / Self Care | Payer: Medicaid Other | Source: Ambulatory Visit | Attending: Urgent Care | Admitting: Urgent Care

## 2022-01-27 VITALS — BP 125/87 | HR 65 | Temp 98.6°F | Resp 18

## 2022-01-27 DIAGNOSIS — J309 Allergic rhinitis, unspecified: Secondary | ICD-10-CM | POA: Insufficient documentation

## 2022-01-27 DIAGNOSIS — J3489 Other specified disorders of nose and nasal sinuses: Secondary | ICD-10-CM | POA: Insufficient documentation

## 2022-01-27 DIAGNOSIS — B349 Viral infection, unspecified: Secondary | ICD-10-CM | POA: Diagnosis not present

## 2022-01-27 DIAGNOSIS — H938X3 Other specified disorders of ear, bilateral: Secondary | ICD-10-CM | POA: Diagnosis not present

## 2022-01-27 DIAGNOSIS — Z20822 Contact with and (suspected) exposure to covid-19: Secondary | ICD-10-CM | POA: Insufficient documentation

## 2022-01-27 MED ORDER — CETIRIZINE HCL 10 MG PO TABS: 10.0000 mg | ORAL_TABLET | Freq: Every day | ORAL | 0 refills | Status: AC

## 2022-01-27 MED ORDER — PREDNISONE 20 MG PO TABS: 40.0000 mg | ORAL_TABLET | Freq: Every day | ORAL | 0 refills | Status: DC

## 2022-01-27 NOTE — ED Triage Notes (Signed)
Pt c/o headache, facial pressure, nasal and ear drainage.   Onset ~ Saturday

## 2022-01-27 NOTE — Discharge Instructions (Signed)
We will notify you of your test results as they arrive and may take between about 24 hours.  I encourage you to sign up for MyChart if you have not already done so as this can be the easiest way for [place] to communicate results to you online or through a phone app.  Generally, we only contact you if it is a positive test result.  In the meantime, if you develop worsening symptoms including fever, chest pain, shortness of breath despite our current treatment plan then please report to the emergency room as this may be a sign of worsening status from possible viral infection.  Otherwise, we will manage this as a viral syndrome. For sore throat or cough try using a honey-based tea. Use 3 teaspoons of honey with juice squeezed from half lemon. Place shaved pieces of ginger into 1/2-1 cup of water and warm over stove top. Then mix the ingredients and repeat every 4 hours as needed. Please take Tylenol '500mg'$ -'650mg'$  every 6 hours for aches and pains, fevers. Hydrate very well with at least 2 liters of water. Eat light meals such as soups to replenish electrolytes and soft fruits, veggies. Start an antihistamine like Zyrtec for postnasal drainage, sinus congestion.  You can take this together with prednisone for your severe allergy flare.

## 2022-01-27 NOTE — ED Provider Notes (Signed)
[facility]  Note:  This document was prepared using Dragon voice recognition software and may include unintentional dictation errors.  MRN: [id number] DOB: 1986-02-17  Subjective:   Mary Harrington is a 36 y.o. female presenting for 4-day history of acute onset sinus pressure, bilateral ear fullness, postnasal drainage, sinus congestion.  Patient has a history of significant allergic rhinitis.  She cannot tolerate Flonase daily but has been doing Claritin and Singulair.  She would like to get COVID tested.  States that she did one at home and was negative.  Has an occasional cough.  No chest pain, shortness of breath or wheezing.  No current facility-administered medications for this encounter.  Current Outpatient Medications:    albuterol (VENTOLIN HFA) 108 (90 Base) MCG/ACT inhaler, Inhale 1 puff into the lungs every 6 (six) hours as needed for wheezing or shortness of breath., Disp: 6.7 g, Rfl: 0   cetirizine (ALLERGY, CETIRIZINE,) 10 MG tablet, Take 1 tablet (10 mg total) by mouth daily., Disp: 30 tablet, Rfl: 0   Emollient (CERAVE SA RENEWING) CREA, Apply 1 application topically 3 (three) times daily., Disp: , Rfl:    fluticasone (FLONASE) 50 MCG/ACT nasal spray, fluticasone propionate 50 mcg/actuation nasal spray,suspension  SHAKE LQ AND U 2 SPRAYS IEN D, Disp: , Rfl:    furosemide (LASIX) 20 MG tablet, Take 1 tablet (20 mg total) by mouth daily as needed., Disp: 6 tablet, Rfl: 0   montelukast (SINGULAIR) 10 MG tablet, montelukast 10 mg tablet, Disp: , Rfl:    rizatriptan (MAXALT) 5 MG tablet, Take 1 tablet (5 mg total) by mouth as needed for migraine. May repeat in 2 hours if needed. No more than 4tab in 24hrs, Disp: 10 tablet, Rfl: 0   topiramate (TOPAMAX) 25 MG tablet, Take 1 tablet (25 mg total) by mouth 2 (two) times daily., Disp: 60 tablet, Rfl: 5   triamcinolone ointment (KENALOG) 0.5 %, Apply 1 application topically 2 (two) times daily., Disp: 100 g, Rfl: 0    Vitamin D, Ergocalciferol, (DRISDOL) 1.25 MG (50000 UNIT) CAPS capsule, Take 50,000 Units by mouth once a week., Disp: , Rfl:    No Known Allergies  Past Medical History:  Diagnosis Date   Anginal pain ([place])    within the last 3 months; occurs center of the chest    Bradycardia    hx of syncope in association ; per patient "i havent fell out in like a year but sometimes i feel like i am about to"    Menorrhagia    Migraines      Past Surgical History:  Procedure Laterality Date   BREAST BIOPSY Right 2015   LAPAROSCOPIC VAGINAL HYSTERECTOMY WITH SALPINGECTOMY Bilateral 01/12/2017   Procedure: LAPAROSCOPIC ASSISTED VAGINAL HYSTERECTOMY WITH bilateral SALPINGECTOMY and ablation of endometriosis;  Surgeon: Everlene Farrier, MD;  Location: [facility];  Service: Gynecology;  Laterality: Bilateral;   TUBAL LIGATION      Family History  Problem Relation Age of Onset   Hypertension Mother    Lung cancer Father 71       melanoma lung cancer   Heart failure Father    Lung cancer Brother    Cervical cancer Paternal Grandmother 50   Sickle cell anemia Paternal Uncle    Bipolar disorder Maternal Grandfather    Schizophrenia Maternal Grandfather    Bipolar disorder Cousin    Schizophrenia Cousin     Social History   Tobacco Use   Smoking status: Former  Packs/day: 0.25    Years: 9.00    Total pack years: 2.25    Types: Cigarettes    Quit date: 06/16/2017    Years since quitting: 4.6   Smokeless tobacco: Never   Tobacco comments:    about 7 cigarettes  a day   Substance Use Topics   Alcohol use: Yes    Comment: Occasional   Drug use: No    ROS   Objective:   Vitals: BP 125/87 (BP Location: Left Arm)   Pulse 65   Temp 98.6 F (37 C) (Oral)   Resp 18   LMP 01/01/2017 (Exact Date)   SpO2 98%   Physical Exam Constitutional:      General: She is not in acute distress.    Appearance: Normal appearance. She is well-developed and normal weight. She is  not ill-appearing, toxic-appearing or diaphoretic.  HENT:     Head: Normocephalic and atraumatic.     Right Ear: Tympanic membrane, ear canal and external ear normal. No drainage or tenderness. No middle ear effusion. There is no impacted cerumen. Tympanic membrane is not erythematous.     Left Ear: Tympanic membrane, ear canal and external ear normal. No drainage or tenderness.  No middle ear effusion. There is no impacted cerumen. Tympanic membrane is not erythematous.     Nose: Congestion present. No rhinorrhea.     Mouth/Throat:     Mouth: Mucous membranes are moist. No oral lesions.     Pharynx: No pharyngeal swelling, oropharyngeal exudate, posterior oropharyngeal erythema or uvula swelling.     Tonsils: No tonsillar exudate or tonsillar abscesses.     Comments: Significant postnasal drainage overlying pharynx. Eyes:     General: No scleral icterus.       Right eye: No discharge.        Left eye: No discharge.     Extraocular Movements: Extraocular movements intact.     Right eye: Normal extraocular motion.     Left eye: Normal extraocular motion.     Conjunctiva/sclera: Conjunctivae normal.  Cardiovascular:     Rate and Rhythm: Normal rate and regular rhythm.     Heart sounds: Normal heart sounds. No murmur heard.    No friction rub. No gallop.  Pulmonary:     Effort: Pulmonary effort is normal. No respiratory distress.     Breath sounds: No stridor. No wheezing, rhonchi or rales.  Chest:     Chest wall: No tenderness.  Musculoskeletal:     Cervical back: Normal range of motion and neck supple.  Lymphadenopathy:     Cervical: No cervical adenopathy.  Skin:    General: Skin is warm and dry.  Neurological:     General: No focal deficit present.     Mental Status: She is alert and oriented to person, place, and time.     Cranial Nerves: No cranial nerve deficit.     Motor: No weakness.     Coordination: Coordination normal.     Gait: Gait normal.  Psychiatric:         Mood and Affect: Mood normal.        Behavior: Behavior normal.     Assessment and Plan :   PDMP not reviewed this encounter.  1. Acute viral syndrome   2. Sinus pressure   3. Allergic rhinitis, unspecified seasonality, unspecified trigger   4. Sensation of fullness in both ears    As patient has severe allergic rhinitis, offered an oral prednisone course.  Use  supportive care otherwise.  Switch from Claritin to Zyrtec daily.  Will manage for viral illness such as viral URI, viral syndrome, viral rhinitis, COVID-19. Recommended supportive care. Offered scripts for symptomatic relief. Testing is pending. Counseled patient on potential for adverse effects with medications prescribed/recommended today, ER and return-to-clinic precautions discussed, patient verbalized understanding.  Discussed   Jaynee Eagles, Hershal Coria 01/27/22 1623

## 2022-01-29 LAB — SARS CORONAVIRUS 2 (TAT 6-24 HRS): SARS Coronavirus 2: NEGATIVE

## 2022-05-22 ENCOUNTER — Encounter: Payer: Self-pay | Admitting: Emergency Medicine

## 2022-05-22 ENCOUNTER — Other Ambulatory Visit: Payer: Self-pay

## 2022-05-22 ENCOUNTER — Ambulatory Visit
Admission: EM | Admit: 2022-05-22 | Discharge: 2022-05-22 | Disposition: A | Discharge status: Home / Self Care | Payer: Medicaid Other | Attending: Family Medicine | Admitting: Family Medicine

## 2022-05-22 DIAGNOSIS — M791 Myalgia, unspecified site: Secondary | ICD-10-CM | POA: Diagnosis present

## 2022-05-22 DIAGNOSIS — R059 Cough, unspecified: Secondary | ICD-10-CM | POA: Diagnosis present

## 2022-05-22 DIAGNOSIS — J029 Acute pharyngitis, unspecified: Secondary | ICD-10-CM | POA: Diagnosis present

## 2022-05-22 DIAGNOSIS — Z1152 Encounter for screening for COVID-19: Secondary | ICD-10-CM | POA: Insufficient documentation

## 2022-05-22 DIAGNOSIS — J069 Acute upper respiratory infection, unspecified: Secondary | ICD-10-CM | POA: Insufficient documentation

## 2022-05-22 DIAGNOSIS — R519 Headache, unspecified: Secondary | ICD-10-CM | POA: Insufficient documentation

## 2022-05-22 MED ORDER — KETOROLAC TROMETHAMINE 30 MG/ML IJ SOLN
30.0000 mg | Freq: Once | INTRAMUSCULAR | Status: AC
  Administered 2022-05-22: 30 mg via INTRAMUSCULAR

## 2022-05-22 MED ORDER — BENZONATATE 100 MG PO CAPS: 100.0000 mg | ORAL_CAPSULE | Freq: Three times a day (TID) | ORAL | 0 refills | Status: AC | PRN

## 2022-05-22 NOTE — Discharge Instructions (Signed)
You have been given a shot of Toradol 30 mg today.  Take benzonatate 100 mg, 1 tab every 8 hours as needed for cough.   You have been swabbed for COVID, and the test will result in the next 24 hours. Our staff will call you if positive. If the COVID test is positive, you should quarantine for 5 days from the start of your symptoms

## 2022-05-22 NOTE — ED Triage Notes (Signed)
Pt here for body aches and cough x 3 days

## 2022-05-22 NOTE — ED Provider Notes (Signed)
[facility]    CSN: [id number] Arrival date & time: 05/22/22  0801      History   Chief Complaint Chief Complaint  Patient presents with   Generalized Body Aches    HPI Mary Harrington is a 36 y.o. female.   HPI Here for nasal congestion/rhinorrhea, cough, sore throat, myalgia and malaise. Symptoms began 12/26. No fever or chills. No n/v/d. No wheezing. No h/o asthma.   She does have a h/o migraines, and has a throbbing headache behind her left eye.  This is consistent with her usual migraine symptoms.  She has not taken one of her rizatriptan's yet  Past Medical History:  Diagnosis Date   Anginal pain ([place])    within the last 3 months; occurs center of the chest    Bradycardia    hx of syncope in association ; per patient "i havent fell out in like a year but sometimes i feel like i am about to"    Menorrhagia    Migraines     Patient Active Problem List   Diagnosis Date Noted   Transaminitis 09/22/2019   Dyspepsia 09/22/2019   Flexural eczema 07/06/2019   Recurrent major depressive disorder, in partial remission ([place]) 07/06/2019   Grief 05/17/2019   H/O sickle cell trait 08/25/2017   Adjustment disorder with mixed anxiety and depressed mood 08/25/2017   Menorrhagia 01/12/2017   Allergic rhinitis 11/13/2016   Tinnitus aurium, left 11/13/2016   Headache 11/13/2016   Class 2 obesity without serious comorbidity with body mass index (BMI) of 36.0 to 36.9 in adult 08/08/2016   Vitamin D deficiency 08/08/2016   Fatigue 03/13/2014   Depressed mood 03/13/2014   Breast lump on right side at 2 o'clock position 03/15/2013    Past Surgical History:  Procedure Laterality Date   BREAST BIOPSY Right 2015   LAPAROSCOPIC VAGINAL HYSTERECTOMY WITH SALPINGECTOMY Bilateral 01/12/2017   Procedure: LAPAROSCOPIC ASSISTED VAGINAL HYSTERECTOMY WITH bilateral SALPINGECTOMY and ablation of endometriosis;  Surgeon: Everlene Farrier, MD;  Location: [facility];  Service: Gynecology;  Laterality: Bilateral;   TUBAL LIGATION      OB History     Gravida  2   Para  2   Term  2   Preterm      AB      Living  2      SAB      IAB      Ectopic      Multiple      Live Births               Home Medications    Prior to Admission medications   Medication Sig Start Date End Date Taking? Authorizing Provider  benzonatate (TESSALON) 100 MG capsule Take 1 capsule (100 mg total) by mouth 3 (three) times daily as needed for cough. 05/22/22  Yes Barrett Henle, MD  albuterol (VENTOLIN HFA) 108 (90 Base) MCG/ACT inhaler Inhale 1 puff into the lungs every 6 (six) hours as needed for wheezing or shortness of breath. 05/02/19   Nche, Charlene Brooke, NP  cetirizine (ALLERGY, CETIRIZINE,) 10 MG tablet Take 1 tablet (10 mg total) by mouth daily. 01/27/22   Jaynee Eagles, PA-C  Emollient (CERAVE SA RENEWING) CREA Apply 1 application topically 3 (three) times daily. 07/06/19   Nche, Charlene Brooke, NP  fluticasone (FLONASE) 50 MCG/ACT nasal spray fluticasone propionate 50 mcg/actuation nasal spray,suspension  SHAKE LQ AND U 2 SPRAYS IEN D    [provider]  furosemide (LASIX) 20 MG tablet Take 1 tablet (20 mg total) by mouth daily as needed. 12/01/20   Rancour, Annie Main, MD  montelukast (SINGULAIR) 10 MG tablet montelukast 10 mg tablet 08/03/20   [provider]  predniSONE (DELTASONE) 20 MG tablet Take 2 tablets (40 mg total) by mouth daily with breakfast. 01/27/22   Jaynee Eagles, PA-C  rizatriptan (MAXALT) 5 MG tablet Take 1 tablet (5 mg total) by mouth as needed for migraine. May repeat in 2 hours if needed. No more than 4tab in 24hrs 09/21/19   Nche, Charlene Brooke, NP  topiramate (TOPAMAX) 25 MG tablet Take 1 tablet (25 mg total) by mouth 2 (two) times daily. 09/22/19   Nche, Charlene Brooke, NP  triamcinolone ointment (KENALOG) 0.5 % Apply 1 application topically 2 (two) times daily. 07/06/19   Nche, Charlene Brooke, NP  Vitamin D,  Ergocalciferol, (DRISDOL) 1.25 MG (50000 UNIT) CAPS capsule Take 50,000 Units by mouth once a week. 11/02/20   [provider]    Family History Family History  Problem Relation Age of Onset   Hypertension Mother    Lung cancer Father 95       melanoma lung cancer   Heart failure Father    Lung cancer Brother    Cervical cancer Paternal Grandmother 51   Sickle cell anemia Paternal Uncle    Bipolar disorder Maternal Grandfather    Schizophrenia Maternal Grandfather    Bipolar disorder Cousin    Schizophrenia Cousin     Social History Social History   Tobacco Use   Smoking status: Former    Packs/day: 0.25    Years: 9.00    Total pack years: 2.25    Types: Cigarettes    Quit date: 06/16/2017    Years since quitting: 4.9   Smokeless tobacco: Never   Tobacco comments:    about 7 cigarettes  a day   Substance Use Topics   Alcohol use: Yes    Comment: Occasional   Drug use: No     Allergies   Patient has no known allergies.   Review of Systems Review of Systems   Physical Exam Triage Vital Signs ED Triage Vitals [05/22/22 0827]  Enc Vitals Group     BP 105/70     Pulse Rate 76     Resp 18     Temp 98.4 F (36.9 C)     Temp Source Oral     SpO2 98 %     Weight      Height      Head Circumference      Peak Flow      Pain Score 8     Pain Loc      Pain Edu?      Excl. in [place]?    No data found.  Updated Vital Signs BP 105/70 (BP Location: Left Arm)   Pulse 76   Temp 98.4 F (36.9 C) (Oral)   Resp 18   LMP 01/01/2017 (Exact Date)   SpO2 98%   Visual Acuity Right Eye Distance:   Left Eye Distance:   Bilateral Distance:    Right Eye Near:   Left Eye Near:    Bilateral Near:     Physical Exam Vitals reviewed.  Constitutional:      General: She is not in acute distress.    Appearance: She is not toxic-appearing.  HENT:     Right Ear: Tympanic membrane and ear canal normal.  Left Ear: Tympanic membrane and ear canal normal.      Nose: Congestion present.     Mouth/Throat:     Mouth: Mucous membranes are moist.     Comments: Erythema of the oropharynx and clear mucus is draining.  No asymmetry or mass or tonsillar hypertrophy Eyes:     Extraocular Movements: Extraocular movements intact.     Conjunctiva/sclera: Conjunctivae normal.     Pupils: Pupils are equal, round, and reactive to light.     Comments: Lids normal  Cardiovascular:     Rate and Rhythm: Normal rate and regular rhythm.     Heart sounds: No murmur heard. Pulmonary:     Effort: Pulmonary effort is normal. No respiratory distress.     Breath sounds: No stridor. No wheezing, rhonchi or rales.  Musculoskeletal:     Cervical back: Neck supple.  Lymphadenopathy:     Cervical: No cervical adenopathy.  Skin:    Capillary Refill: Capillary refill takes less than 2 seconds.     Coloration: Skin is not jaundiced or pale.  Neurological:     General: No focal deficit present.     Mental Status: She is alert and oriented to person, place, and time.  Psychiatric:        Behavior: Behavior normal.      UC Treatments / Results  Labs (all labs ordered are listed, but only abnormal results are displayed) Labs Reviewed  SARS CORONAVIRUS 2 (TAT 6-24 HRS)    EKG   Radiology No results found.  Procedures Procedures (including critical care time)  Medications Ordered in UC Medications  ketorolac (TORADOL) 30 MG/ML injection 30 mg (has no administration in time range)    Initial Impression / Assessment and Plan / UC Course  I have reviewed the triage vital signs and the nursing notes.  Pertinent labs & imaging results that were available during my care of the patient were reviewed by me and considered in my medical decision making (see chart for details).        With no fever, I will not test for flu.  COVID test is done and if positive she is a candidate for Paxlovid: Last EGFR was greater than 60.  Junious Dresser is given for the headache, but  she may also take her Maxalt at home when she gets there. Final Clinical Impressions(s) / UC Diagnoses   Final diagnoses:  Viral URI with cough     Discharge Instructions      You have been given a shot of Toradol 30 mg today.  Take benzonatate 100 mg, 1 tab every 8 hours as needed for cough.   You have been swabbed for COVID, and the test will result in the next 24 hours. Our staff will call you if positive. If the COVID test is positive, you should quarantine for 5 days from the start of your symptoms      ED Prescriptions     Medication Sig Dispense Auth. Provider   benzonatate (TESSALON) 100 MG capsule Take 1 capsule (100 mg total) by mouth 3 (three) times daily as needed for cough. 21 capsule Barrett Henle, MD      PDMP not reviewed this encounter.   Barrett Henle, MD 05/22/22 [phone]

## 2022-05-23 LAB — SARS CORONAVIRUS 2 (TAT 6-24 HRS): SARS Coronavirus 2: NEGATIVE

## 2022-10-02 IMAGING — DX DG CHEST 2V
2 series · 2 of 2 positions shown · non-contrast
Comparison: 04/29/2019

CLINICAL DATA: Shortness of breath, leg swelling

EXAM:
CHEST - 2 VIEW

[chest pa]
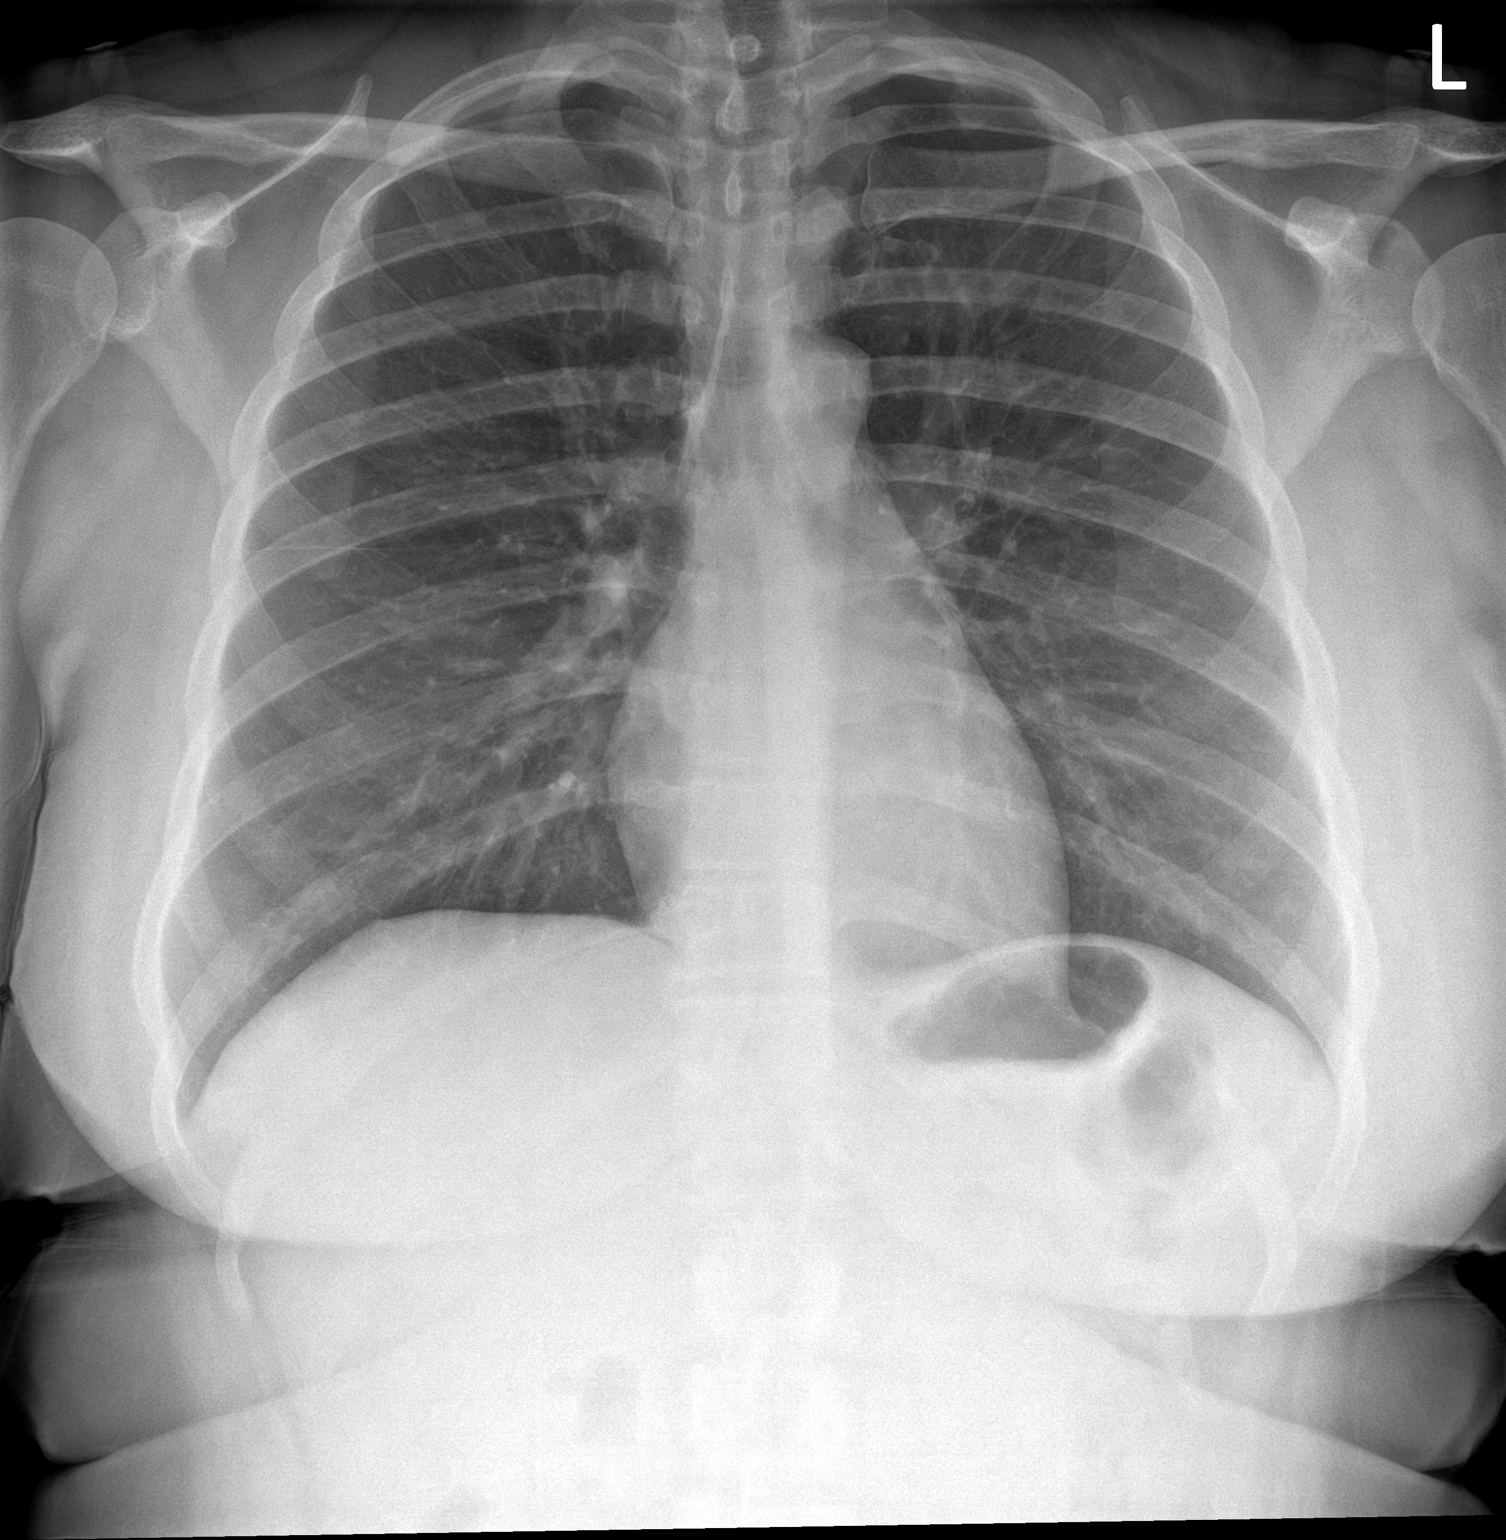

[chest lat]
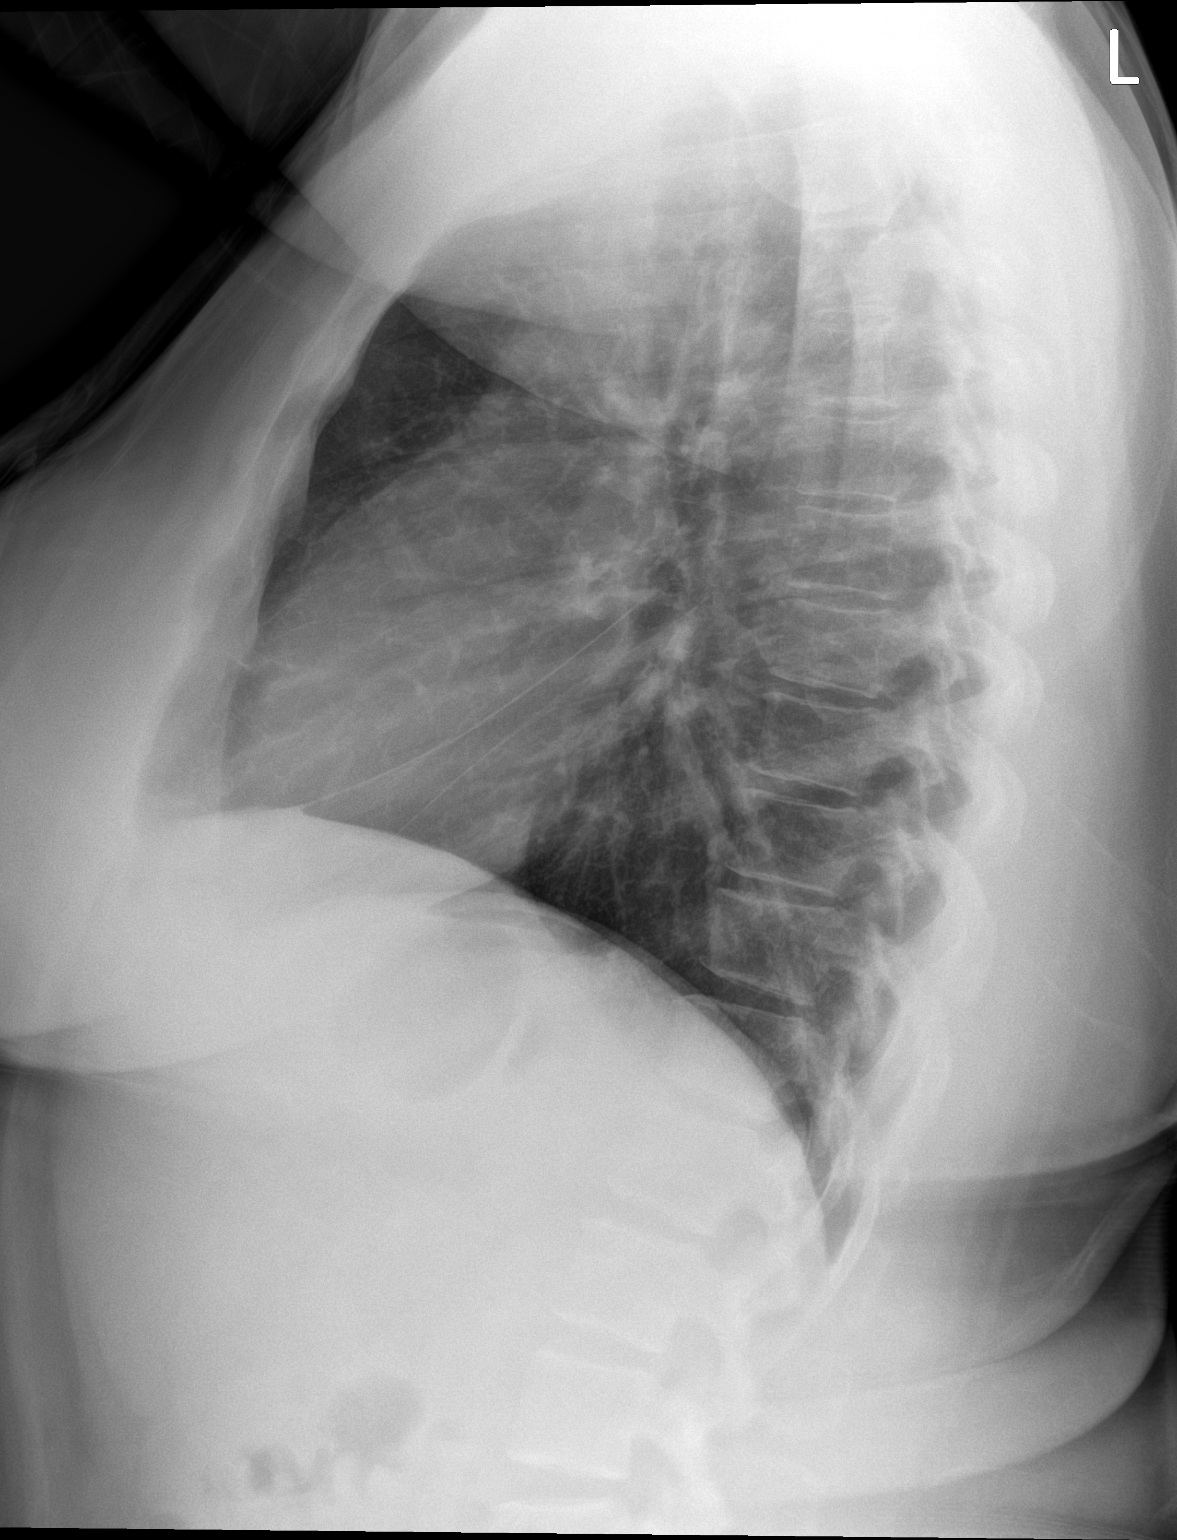

[2 of 2 positions shown; findings below may reference images not displayed]

FINDINGS: No consolidation, features of edema, pneumothorax, or effusion.
Pulmonary vascularity is normally distributed. The cardiomediastinal
contours are unremarkable. No acute osseous or soft tissue
abnormality.
IMPRESSION: No acute cardiopulmonary abnormality.

## 2023-02-02 ENCOUNTER — Emergency Department (HOSPITAL_BASED_OUTPATIENT_CLINIC_OR_DEPARTMENT_OTHER)
Admission: EM | Admit: 2023-02-02 | Discharge: 2023-02-02 | Disposition: A | Discharge status: Home / Self Care | Payer: Medicaid Other | Attending: Emergency Medicine | Admitting: Emergency Medicine

## 2023-02-02 ENCOUNTER — Encounter (HOSPITAL_BASED_OUTPATIENT_CLINIC_OR_DEPARTMENT_OTHER): Payer: Self-pay

## 2023-02-02 ENCOUNTER — Other Ambulatory Visit: Payer: Self-pay

## 2023-02-02 DIAGNOSIS — Z20822 Contact with and (suspected) exposure to covid-19: Secondary | ICD-10-CM | POA: Insufficient documentation

## 2023-02-02 DIAGNOSIS — J3489 Other specified disorders of nose and nasal sinuses: Secondary | ICD-10-CM | POA: Insufficient documentation

## 2023-02-02 DIAGNOSIS — R0981 Nasal congestion: Secondary | ICD-10-CM | POA: Diagnosis not present

## 2023-02-02 LAB — RESP PANEL BY RT-PCR (RSV, FLU A&B, COVID)  RVPGX2
Influenza A by PCR: NEGATIVE
Influenza B by PCR: NEGATIVE
Resp Syncytial Virus by PCR: NEGATIVE
SARS Coronavirus 2 by RT PCR: NEGATIVE

## 2023-02-02 MED ORDER — PREDNISOLONE ACETATE 1 % OP SUSP: 1.0000 [drp] | Freq: Once | OPHTHALMIC | Status: DC

## 2023-02-02 MED ORDER — CYCLOPENTOLATE HCL 1 % OP SOLN: 1.0000 [drp] | Freq: Once | OPHTHALMIC | Status: DC

## 2023-02-02 MED ORDER — KETOROLAC TROMETHAMINE 30 MG/ML IJ SOLN
30.0000 mg | Freq: Once | INTRAMUSCULAR | Status: AC
  Administered 2023-02-02: 30 mg via INTRAMUSCULAR
  Filled 2023-02-02: qty 1

## 2023-02-02 NOTE — ED Triage Notes (Signed)
Pt reports that she has had nasal congestion, headache and ear "fullness" x 2-3 days. Denis cough.

## 2023-02-02 NOTE — ED Provider Notes (Signed)
[place] EMERGENCY DEPARTMENT AT [facility] Provider Note   CSN: [id number] Arrival date & time: 02/02/23  0207     History  Chief Complaint  Patient presents with   Nasal Congestion    Mary Harrington is a 37 y.o. female.  HPI     This is a 36 year old female who presents with sinus pressure, congestion, ear fullness.  Reports onset of symptoms on Thursday.  Has not had any fevers.  She reports left greater than right ear fullness and pressure behind the left maxillary sinus.  She has had nasal drainage.  No cough, shortness of breath, chest pain.  No known sick contacts.  She reports using Flonase and nasal saline.  Home Medications Prior to Admission medications   Medication Sig Start Date End Date Taking? Authorizing Provider  albuterol (VENTOLIN HFA) 108 (90 Base) MCG/ACT inhaler Inhale 1 puff into the lungs every 6 (six) hours as needed for wheezing or shortness of breath. 05/02/19   Nche, Bonna Gains, NP  benzonatate (TESSALON) 100 MG capsule Take 1 capsule (100 mg total) by mouth 3 (three) times daily as needed for cough. 05/22/22   Zenia Resides, MD  cetirizine (ALLERGY, CETIRIZINE,) 10 MG tablet Take 1 tablet (10 mg total) by mouth daily. 01/27/22   Wallis Bamberg, PA-C  Emollient (CERAVE SA RENEWING) CREA Apply 1 application topically 3 (three) times daily. 07/06/19   Nche, Bonna Gains, NP  fluticasone (FLONASE) 50 MCG/ACT nasal spray fluticasone propionate 50 mcg/actuation nasal spray,suspension  SHAKE LQ AND U 2 SPRAYS IEN D    [provider]  furosemide (LASIX) 20 MG tablet Take 1 tablet (20 mg total) by mouth daily as needed. 12/01/20   Rancour, Jeannett Senior, MD  montelukast (SINGULAIR) 10 MG tablet montelukast 10 mg tablet 08/03/20   [provider]  predniSONE (DELTASONE) 20 MG tablet Take 2 tablets (40 mg total) by mouth daily with breakfast. 01/27/22   Wallis Bamberg, PA-C  rizatriptan (MAXALT) 5 MG tablet Take 1 tablet (5 mg total) by mouth  as needed for migraine. May repeat in 2 hours if needed. No more than 4tab in 24hrs 09/21/19   Nche, Bonna Gains, NP  topiramate (TOPAMAX) 25 MG tablet Take 1 tablet (25 mg total) by mouth 2 (two) times daily. 09/22/19   Nche, Bonna Gains, NP  triamcinolone ointment (KENALOG) 0.5 % Apply 1 application topically 2 (two) times daily. 07/06/19   Nche, Bonna Gains, NP  Vitamin D, Ergocalciferol, (DRISDOL) 1.25 MG (50000 UNIT) CAPS capsule Take 50,000 Units by mouth once a week. 11/02/20   [provider]      Allergies    Patient has no known allergies.    Review of Systems   Review of Systems  Constitutional:  Negative for fever.  HENT:  Positive for congestion, ear pain and sinus pressure.   Respiratory:  Negative for cough and shortness of breath.   All other systems reviewed and are negative.   Physical Exam Updated Vital Signs BP 114/68   Pulse 65   Temp 98.8 F (37.1 C) (Oral)   Resp 18   LMP 01/01/2017 (Exact Date)   SpO2 100%  Physical Exam Vitals and nursing note reviewed.  Constitutional:      Appearance: She is well-developed. She is not ill-appearing.  HENT:     Head: Normocephalic and atraumatic.     Ears:     Comments: Bilateral TMs nonerythematous, slight effusions noted bilaterally, no bulging    Nose: Congestion  present.     Mouth/Throat:     Mouth: Mucous membranes are moist.  Eyes:     Pupils: Pupils are equal, round, and reactive to light.  Cardiovascular:     Rate and Rhythm: Normal rate and regular rhythm.     Heart sounds: Normal heart sounds.  Pulmonary:     Effort: Pulmonary effort is normal. No respiratory distress.     Breath sounds: No wheezing.  Abdominal:     Palpations: Abdomen is soft.  Musculoskeletal:     Cervical back: Neck supple.  Skin:    General: Skin is warm and dry.  Neurological:     Mental Status: She is alert and oriented to person, place, and time.  Psychiatric:        Mood and Affect: Mood normal.     ED  Results / Procedures / Treatments   Labs (all labs ordered are listed, but only abnormal results are displayed) Labs Reviewed  RESP PANEL BY RT-PCR (RSV, FLU A&B, COVID)  [id number]    EKG None  Radiology No results found.  Procedures Procedures    Medications Ordered in ED Medications  ketorolac (TORADOL) 30 MG/ML injection 30 mg (30 mg Intramuscular Given 02/02/23 0304)    ED Course/ Medical Decision Making/ A&P                                 Medical Decision Making Risk Prescription drug management.   This patient presents to the ED for concern of upper respiratory symptoms, sinus congestion, this involves an extensive number of treatment options, and is a complaint that carries with it a high risk of complications and morbidity.  I considered the following differential and admission for this acute, potentially life threatening condition.  The differential diagnosis includes viral upper respiratory infection including COVID or influenza, allergic rhinitis, otitis media, less likely bacterial sinusitis  MDM:    This is a 37 year old female who presents with congestion and sinus pressure as well as ear pain.  She is nontoxic and vital signs are reassuring.  She is afebrile.  Physical exam is fairly benign.  While she does endorse sinus pressure that lateralizes to the left, highly suspect viral etiology given duration of symptoms.  Would have lower suspicion for bacterial etiology at this point.  No evidence of acute otitis media.  COVID and influenza testing are negative.  Discussed ongoing supportive measures including continuing Flonase and nasal saline.  Will add Mucinex.  Recommend ibuprofen or Tylenol.  Discussed with patient that there was no indication at this time for antibiotics.  However if she develops fever or has symptoms beyond 1 week, she needs to be reevaluated.  (Labs, imaging, consults)  Labs: I Ordered, and personally interpreted labs.  The pertinent results  include: COVID and influenza  Imaging Studies ordered: I ordered imaging studies including none I independently visualized and interpreted imaging. I agree with the radiologist interpretation  Additional history obtained from chart review.  External records from outside source obtained and reviewed including prior evaluations  Cardiac Monitoring: The patient was not maintained on a cardiac monitor.  If on the cardiac monitor, I personally viewed and interpreted the cardiac monitored which showed an underlying rhythm of: N/A  Reevaluation: After the interventions noted above, I reevaluated the patient and found that they have :stayed the same  Social Determinants of Health:  lives independently  Disposition: Discharge  Co morbidities that  complicate the patient evaluation  Past Medical History:  Diagnosis Date   Anginal pain (HCC)    within the last 3 months; occurs center of the chest    Bradycardia    hx of syncope in association ; per patient "i havent fell out in like a year but sometimes i feel like i am about to"    Menorrhagia    Migraines      Medicines Meds ordered this encounter  Medications   ketorolac (TORADOL) 30 MG/ML injection 30 mg   DISCONTD: prednisoLONE acetate (PRED FORTE) 1 % ophthalmic suspension 1 drop   DISCONTD: cyclopentolate (CYCLODRYL,CYCLOGYL) 1 % ophthalmic solution 1 drop    I have reviewed the patients home medicines and have made adjustments as needed  Problem List / ED Course: Problem List Items Addressed This Visit   None Visit Diagnoses     Sinus pressure    -  Primary   Nasal congestion                       Final Clinical Impression(s) / ED Diagnoses Final diagnoses:  Sinus pressure  Nasal congestion    Rx / DC Orders ED Discharge Orders     None         Shon Baton, MD 02/02/23 [phone]

## 2023-02-02 NOTE — Discharge Instructions (Signed)
You were seen today for sinus pressure and upper respiratory congestion.  Your COVID, influenza testing is negative.  This is likely viral in nature.  Make sure that you continue Flonase and nasal saline.  You may add Mucinex.  Take ibuprofen for any pain.  A humidifier may also help break up the drainage.  There is no indication for antibiotics at this time.  However, if you develop fever or ongoing pain past 1 week, you need to be reevaluated.

## 2023-02-07 ENCOUNTER — Emergency Department (HOSPITAL_BASED_OUTPATIENT_CLINIC_OR_DEPARTMENT_OTHER): Payer: Medicaid Other

## 2023-02-07 ENCOUNTER — Encounter (HOSPITAL_BASED_OUTPATIENT_CLINIC_OR_DEPARTMENT_OTHER): Payer: Self-pay | Admitting: Emergency Medicine

## 2023-02-07 ENCOUNTER — Emergency Department (HOSPITAL_BASED_OUTPATIENT_CLINIC_OR_DEPARTMENT_OTHER)
Admission: EM | Admit: 2023-02-07 | Discharge: 2023-02-07 | Disposition: A | Discharge status: Home / Self Care | Payer: Medicaid Other | Attending: Emergency Medicine | Admitting: Emergency Medicine

## 2023-02-07 ENCOUNTER — Other Ambulatory Visit: Payer: Self-pay

## 2023-02-07 DIAGNOSIS — J0110 Acute frontal sinusitis, unspecified: Secondary | ICD-10-CM | POA: Diagnosis not present

## 2023-02-07 DIAGNOSIS — R0981 Nasal congestion: Secondary | ICD-10-CM | POA: Diagnosis present

## 2023-02-07 MED ORDER — DEXAMETHASONE 4 MG PO TABS
16.0000 mg | ORAL_TABLET | Freq: Once | ORAL | Status: AC
  Administered 2023-02-07: 16 mg via ORAL
  Filled 2023-02-07: qty 4

## 2023-02-07 MED ORDER — AMOXICILLIN-POT CLAVULANATE 875-125 MG PO TABS
1.0000 | ORAL_TABLET | Freq: Once | ORAL | Status: AC
  Administered 2023-02-07: 1 via ORAL
  Filled 2023-02-07: qty 1

## 2023-02-07 MED ORDER — AMOXICILLIN-POT CLAVULANATE 875-125 MG PO TABS: 1.0000 | ORAL_TABLET | Freq: Two times a day (BID) | ORAL | 0 refills | Status: AC

## 2023-02-07 MED ORDER — KETOROLAC TROMETHAMINE 60 MG/2ML IM SOLN
30.0000 mg | Freq: Once | INTRAMUSCULAR | Status: AC
  Administered 2023-02-07: 30 mg via INTRAMUSCULAR
  Filled 2023-02-07: qty 2

## 2023-02-07 NOTE — ED Triage Notes (Signed)
Pt reports nasal congestion, headache, ear pain, and chest pressure.  Dry cough. Seen last week for same. Reports using OTC meds with no relief.  No fever/chills.  No n/v/d or shob.

## 2023-02-07 NOTE — ED Provider Notes (Signed)
[place] EMERGENCY DEPARTMENT AT [facility] Provider Note   CSN: [id number] Arrival date & time: 02/07/23  0201     History  Chief Complaint  Patient presents with   Nasal Congestion   Headache   Chest Pain    Mary Harrington is a 37 y.o. female.  37 year old female that presents the ER today secondary to persistent congestion, cough, sinus pain.  Patient states that she was here on the ninth for similar symptoms.  She states that time she only been sick for 4 to 5 days.  She went home and has tried Flonase, nasal rinses, Sudafed, NyQuil, DayQuil and any other over-the-counter medication she can find and it has not really helped much.  Some diarrhea but no vomiting.  Eating and drinking okay.  No trauma.  No rashes.  No other associated symptoms.   Headache Chest Pain Associated symptoms: headache        Home Medications Prior to Admission medications   Medication Sig Start Date End Date Taking? Authorizing Provider  amoxicillin-clavulanate (AUGMENTIN) 875-125 MG tablet Take 1 tablet by mouth every 12 (twelve) hours for 10 days. 02/07/23 02/17/23 Yes Carollynn Pennywell, Barbara Cower, MD  albuterol (VENTOLIN HFA) 108 (90 Base) MCG/ACT inhaler Inhale 1 puff into the lungs every 6 (six) hours as needed for wheezing or shortness of breath. 05/02/19   Nche, Bonna Gains, NP  benzonatate (TESSALON) 100 MG capsule Take 1 capsule (100 mg total) by mouth 3 (three) times daily as needed for cough. 05/22/22   Zenia Resides, MD  cetirizine (ALLERGY, CETIRIZINE,) 10 MG tablet Take 1 tablet (10 mg total) by mouth daily. 01/27/22   Wallis Bamberg, PA-C  Emollient (CERAVE SA RENEWING) CREA Apply 1 application topically 3 (three) times daily. 07/06/19   Nche, Bonna Gains, NP  fluticasone (FLONASE) 50 MCG/ACT nasal spray fluticasone propionate 50 mcg/actuation nasal spray,suspension  SHAKE LQ AND U 2 SPRAYS IEN D    [provider]  furosemide (LASIX) 20 MG tablet Take 1 tablet (20 mg total)  by mouth daily as needed. 12/01/20   Rancour, Jeannett Senior, MD  montelukast (SINGULAIR) 10 MG tablet montelukast 10 mg tablet 08/03/20   [provider]  predniSONE (DELTASONE) 20 MG tablet Take 2 tablets (40 mg total) by mouth daily with breakfast. 01/27/22   Wallis Bamberg, PA-C  rizatriptan (MAXALT) 5 MG tablet Take 1 tablet (5 mg total) by mouth as needed for migraine. May repeat in 2 hours if needed. No more than 4tab in 24hrs 09/21/19   Nche, Bonna Gains, NP  topiramate (TOPAMAX) 25 MG tablet Take 1 tablet (25 mg total) by mouth 2 (two) times daily. 09/22/19   Nche, Bonna Gains, NP  triamcinolone ointment (KENALOG) 0.5 % Apply 1 application topically 2 (two) times daily. 07/06/19   Nche, Bonna Gains, NP  Vitamin D, Ergocalciferol, (DRISDOL) 1.25 MG (50000 UNIT) CAPS capsule Take 50,000 Units by mouth once a week. 11/02/20   [provider]      Allergies    Patient has no known allergies.    Review of Systems   Review of Systems  Cardiovascular:  Positive for chest pain.  Neurological:  Positive for headaches.    Physical Exam Updated Vital Signs BP (!) 139/103 (BP Location: Right Arm)   Pulse 67   Temp 98 F (36.7 C) (Oral)   Resp 17   LMP 01/01/2017 (Exact Date)   SpO2 100%  Physical Exam Vitals and nursing note reviewed.  Constitutional:  Appearance: She is well-developed.  HENT:     Head: Normocephalic and atraumatic.     Comments: Left nasal turbinate edema, right middle ear effusion without erythema, purulence or bulging, posterior oropharyngeal erythema.  Left frontal sinus tenderness. Cardiovascular:     Rate and Rhythm: Normal rate and regular rhythm.  Pulmonary:     Effort: No respiratory distress.     Breath sounds: No stridor.  Abdominal:     General: There is no distension.  Musculoskeletal:     Cervical back: Normal range of motion.  Neurological:     Mental Status: She is alert.     ED Results / Procedures / Treatments   Labs (all labs  ordered are listed, but only abnormal results are displayed) Labs Reviewed - No data to display  EKG EKG Interpretation Date/Time:  Saturday February 07 2023 03:56:56 EDT Ventricular Rate:  48 PR Interval:  154 QRS Duration:  70 QT Interval:  436 QTC Calculation: 389 R Axis:   52  Text Interpretation: Sinus bradycardia Otherwise normal ECG When compared with ECG of 01-Aug-2013 13:51, No significant change was found Confirmed by Marily Memos [phone]) on 02/07/2023 4:02:48 AM  Radiology DG Chest Port 1 View  Result Date: 02/07/2023 CLINICAL DATA:  In chest congestion EXAM: PORTABLE CHEST 1 VIEW COMPARISON:  12/01/2020 FINDINGS: The heart size and mediastinal contours are within normal limits. Both lungs are clear. The visualized skeletal structures are unremarkable. IMPRESSION: No active disease. Electronically Signed   By: Tiburcio Pea M.D.   On: 02/07/2023 04:20    Procedures Procedures    Medications Ordered in ED Medications  amoxicillin-clavulanate (AUGMENTIN) 875-125 MG per tablet 1 tablet (1 tablet Oral Given 02/07/23 0550)  dexamethasone (DECADRON) tablet 16 mg (16 mg Oral Given 02/07/23 0550)  ketorolac (TORADOL) injection 30 mg (30 mg Intramuscular Given 02/07/23 0550)    ED Course/ Medical Decision Making/ A&P                                 Medical Decision Making Amount and/or Complexity of Data Reviewed Radiology: ordered.  Risk Prescription drug management.   Patient is afebrile and overall appears well.  Does not have any purulent drainage or purulent/productive cough however with her duration of symptoms that have not abated at all with over-the-counter medications I feel it is prudent to treat with antibiotics at this point.  Toradol here for pain we will continue anti-inflammatories at home.  Steroids for symptomatic treatment.  Final Clinical Impression(s) / ED Diagnoses Final diagnoses:  Acute frontal sinusitis, recurrence not specified    Rx / DC  Orders ED Discharge Orders          Ordered    amoxicillin-clavulanate (AUGMENTIN) 875-125 MG tablet  Every 12 hours        02/07/23 0540              Arriana Lohmann, Barbara Cower, MD 02/07/23 1308

## 2023-02-07 NOTE — ED Notes (Signed)
RN called to lobby for pt having chest tightness, EKG captured and given to MD mesner.

## 2023-11-06 ENCOUNTER — Ambulatory Visit
Admission: RE | Admit: 2023-11-06 | Discharge: 2023-11-06 | Disposition: A | Discharge status: Home / Self Care | Source: Ambulatory Visit

## 2023-11-06 ENCOUNTER — Ambulatory Visit: Payer: Self-pay | Admitting: Physician Assistant

## 2023-11-06 ENCOUNTER — Ambulatory Visit (INDEPENDENT_AMBULATORY_CARE_PROVIDER_SITE_OTHER)

## 2023-11-06 VITALS — BP 119/78 | HR 72 | Temp 98.1°F | Resp 18 | Wt 204.4 lb

## 2023-11-06 DIAGNOSIS — M545 Low back pain, unspecified: Secondary | ICD-10-CM

## 2023-11-06 MED ORDER — METHOCARBAMOL 500 MG PO TABS
500.0000 mg | ORAL_TABLET | Freq: Two times a day (BID) | ORAL | 0 refills | Status: AC
Start: 2023-11-06 — End: ?

## 2023-11-06 MED ORDER — NAPROXEN 500 MG PO TABS
500.0000 mg | ORAL_TABLET | Freq: Two times a day (BID) | ORAL | 0 refills | Status: AC
Start: 2023-11-06 — End: ?

## 2023-11-06 MED ORDER — LIDOCAINE 5 % EX PTCH
1.0000 | MEDICATED_PATCH | CUTANEOUS | 0 refills | Status: AC
Start: 2023-11-06 — End: ?
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
## 2023-11-06 NOTE — Discharge Instructions (Signed)
 I did not see anything on your x-ray.  I will contact you if the radiologist sees something that I did not and changes our treatment plan.  Take Naprosyn twice daily for pain relief.  Do not take additional NSAIDs with this medication including aspirin, ibuprofen /Advil , naproxen/Aleve.  You can use Tylenol /acetaminophen  for additional pain relief.  Take Robaxin  up to twice a day.  This will make you sleepy so do not drive or drink alcohol with.  Apply lidocaine  patch during the day.  Remove this at night.  Use only 1 patch per 24 hours.  I do recommend following up with sports medicine if your symptoms are not improving within a few days.  Call them to schedule an appointment.  If anything worsens and you have increasing pain, weakness in your legs, numbness or tingling in your legs, going to the bathroom and yourself about noticing it you need to go to the ER.
